# Patient Record
Sex: Male | Born: 1990 | Race: Black or African American | Hispanic: No | Marital: Single | Smoking: Current every day smoker
Health system: Southern US, Community
[De-identification: ages and names within clinical notes are randomized; demographics above are authoritative.]

## PROBLEM LIST (undated history)

## (undated) DIAGNOSIS — K5090 Crohn's disease, unspecified, without complications: Secondary | ICD-10-CM

## (undated) DIAGNOSIS — S060XAA Concussion with loss of consciousness status unknown, initial encounter: Secondary | ICD-10-CM

## (undated) DIAGNOSIS — R634 Abnormal weight loss: Secondary | ICD-10-CM

## (undated) DIAGNOSIS — G2569 Other tics of organic origin: Secondary | ICD-10-CM

## (undated) DIAGNOSIS — S060X9A Concussion with loss of consciousness of unspecified duration, initial encounter: Secondary | ICD-10-CM

## (undated) DIAGNOSIS — F23 Brief psychotic disorder: Secondary | ICD-10-CM

## (undated) DIAGNOSIS — H9190 Unspecified hearing loss, unspecified ear: Secondary | ICD-10-CM

## (undated) HISTORY — DX: Other tics of organic origin: G25.69

## (undated) HISTORY — DX: Concussion with loss of consciousness status unknown, initial encounter: S06.0XAA

## (undated) HISTORY — DX: Unspecified hearing loss, unspecified ear: H91.90

## (undated) HISTORY — DX: Abnormal weight loss: R63.4

## (undated) HISTORY — DX: Crohn's disease, unspecified, without complications: K50.90

## (undated) HISTORY — PX: KNEE SURGERY: SHX244

## (undated) HISTORY — DX: Brief psychotic disorder: F23

## (undated) HISTORY — DX: Concussion with loss of consciousness of unspecified duration, initial encounter: S06.0X9A

---
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
## 2010-01-24 ENCOUNTER — Ambulatory Visit: Payer: Self-pay | Admitting: Pediatrics

## 2010-12-24 ENCOUNTER — Encounter (INDEPENDENT_AMBULATORY_CARE_PROVIDER_SITE_OTHER): Payer: Self-pay | Admitting: *Deleted

## 2011-01-03 NOTE — Letter (Signed)
Summary: New Patient letter  [facility]  [address] [place], [place] [address]   Phone: [phone]  Fax: [phone]       12/24/2010 MRN: [id number]  [facility] [address] [place], [place]  [address]  Dear Mr. Steuber,  Welcome to the Gastroenterology Division at [facility].    You are scheduled to see Dr.  Rob Bunting on February 04, 2011 at 8:30am on the 3rd floor at [facility], [address]  We ask that you try to arrive at our office 15 minutes prior to your appointment time to allow for check-in We would like you to complete the enclosed self-administered evaluation form prior to your visit and bring it with you on the day of your appointment.  We will review it with you.  Also, please bring a complete list of all your medications or, if you prefer, bring the medication bottles and we will list them.  Please bring your insurance card so that we may make a copy of it.  If your insurance requires a referral to see a specialist, please bring your referral form from your primary care physician.  Co-payments are due at the time of your visit and may be paid by cash, check or credit card.     Your office visit will consist of a consult with your physician (includes a physical exam), any laboratory testing he/she may order, scheduling of any necessary diagnostic testing (e.g. x-ray, ultrasound, CT-scan), and scheduling of a procedure (e.g. Endoscopy, Colonoscopy) if required.  Please allow enough time on your schedule to allow for any/all of these possibilities.    If you cannot keep your appointment, please call [phone] to cancel or reschedule prior to your appointment date.  This allows [place] the opportunity to schedule an appointment for another patient in need of care.  If you do not cancel or reschedule by 5 p.m. the business day prior to your appointment date, you will be charged a $50.00 late cancellation/no-show fee.    Thank you for  choosing [facility] for your medical needs.  We appreciate the opportunity to care for you.  Please visit [place] at our website  to learn more about our practice.                     Sincerely,                                                             The Gastroenterology Division

## 2011-01-14 LAB — URINALYSIS, MICROSCOPIC ONLY
Bilirubin Urine: NEGATIVE
Leukocytes, UA: NEGATIVE
Nitrite: NEGATIVE
Specific Gravity, Urine: 1.031 — ABNORMAL HIGH (ref 1.005–1.030)
Urobilinogen, UA: 1 mg/dL (ref 0.0–1.0)
pH: 6.5 (ref 5.0–8.0)

## 2011-01-14 LAB — DIFFERENTIAL
Basophils Relative: 1 % (ref 0–1)
Eosinophils Relative: 2 % (ref 0–5)
Lymphocytes Relative: 10 % — ABNORMAL LOW (ref 24–48)
Monocytes Absolute: 0.3 10*3/uL (ref 0.2–1.2)
Monocytes Relative: 6 % (ref 3–11)
Neutro Abs: 4.3 10*3/uL (ref 1.7–8.0)

## 2011-01-14 LAB — BASIC METABOLIC PANEL
CO2: 28 mEq/L (ref 19–32)
Calcium: 8.8 mg/dL (ref 8.4–10.5)
Glucose, Bld: 75 mg/dL (ref 70–99)
Potassium: 3.7 mEq/L (ref 3.5–5.1)
Sodium: 136 mEq/L (ref 135–145)

## 2011-01-14 LAB — DRUGS OF ABUSE SCREEN W/O ALC, ROUTINE URINE
Amphetamine Screen, Ur: NEGATIVE
Benzodiazepines.: NEGATIVE
Creatinine,U: 285.1 mg/dL
Marijuana Metabolite: POSITIVE — AB
Opiate Screen, Urine: NEGATIVE
Propoxyphene: NEGATIVE

## 2011-01-14 LAB — HEMOGLOBIN A1C
Hgb A1c MFr Bld: 5.8 % (ref 4.6–6.1)
Mean Plasma Glucose: 120 mg/dL

## 2011-01-14 LAB — HEPATIC FUNCTION PANEL
AST: 17 U/L (ref 0–37)
Albumin: 3.1 g/dL — ABNORMAL LOW (ref 3.5–5.2)
Alkaline Phosphatase: 71 U/L (ref 52–171)
Bilirubin, Direct: 0.1 mg/dL (ref 0.0–0.3)
Total Bilirubin: 0.5 mg/dL (ref 0.3–1.2)

## 2011-01-14 LAB — LIPID PANEL
Cholesterol: 125 mg/dL (ref 0–169)
LDL Cholesterol: 70 mg/dL (ref 0–109)
Triglycerides: 61 mg/dL (ref <150)

## 2011-01-14 LAB — CBC
HCT: 41.5 % (ref 36.0–49.0)
Hemoglobin: 13.6 g/dL (ref 12.0–16.0)
MCHC: 32.9 g/dL (ref 31.0–37.0)
RBC: 5.18 MIL/uL (ref 3.80–5.70)
RDW: 14 % (ref 11.4–15.5)

## 2011-01-14 LAB — GC/CHLAMYDIA PROBE AMP, URINE: GC Probe Amp, Urine: NEGATIVE

## 2011-02-04 ENCOUNTER — Ambulatory Visit: Payer: Self-pay | Admitting: Gastroenterology

## 2011-02-19 NOTE — Procedures (Signed)
EEG NUMBER:  [id number].   CLINICAL HISTORY:  The patient is a 20 year old admitted because he was  not interacting with his family.  There is a history of possible  psychotic disorder and anger problems.  The patient had Crohn disease.  He was diagnosed with attention deficit disorder in the past; placed on  Ritalin, which was stopped when he developed motor tic disorder.  (780.02)   PROCEDURE:  The tracing is carried out of 32-channel digital Cadwell  recorder reformatted into 16-channel montages with one devoted to EKG.  The patient was awake and drowsy during the recording.  The  International 10/20 system lead placement was used.   MEDICATIONS:  Include prednisone and Abilify.   DESCRIPTION OF FINDINGS:  Dominant frequency is a 10 Hz, well modulated  and regulated 15-75 microvolt activity that attenuates briskly with eye  opening.  Background activity is predominately beta when the patient's  eyes were open and they were very well defined, broadly distributed  alpha range activity with eye closure.  The patient becomes drowsy with  mixed frequency theta range activity of 5-6 Hz and 30-50 microvolts and  drifts into natural sleep with vertex sharp waves and fragmentary sleep  spindles superimposed upon a delta range background.   Hyperventilation causes arousal again.  Photic stimulation was not  carried out.  EKG showed regular sinus rhythm with ventricular response  of 66 beats per minute.   IMPRESSION:  Normal record with the patient awake and asleep.      Deanna Artis. Sharene Skeans, M.D.  Electronically Signed     EAV:WUJW  D:  07/18/2008 20:09:39  T:  07/19/2008 02:44:49  Job #:  [id number]   cc:   Nelly Rout, MD
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
## 2011-02-22 NOTE — Op Note (Signed)
[name], [name]               ACCOUNT NO.:  [id number]   MEDICAL RECORD NO.:  [id number]          PATIENT TYPE:  AMB   LOCATION:  SDS                          FACILITY:  [facility]   PHYSICIAN:  Jon Gills, M.D.  DATE OF BIRTH:  05-06-1991   DATE OF PROCEDURE:  12/20/2005  DATE OF DISCHARGE:  12/20/2005                                 OPERATIVE REPORT   PREOPERATIVE DIAGNOSIS:  Hematochezia, weight loss and abdominal pain.   POSTOPERATIVE DIAGNOSIS:  Hematochezia, weight loss and abdominal pain.   OPERATION PERFORMED:  Colonoscopy with biopsy.   SURGEON:  Jon Gills, M.D.   ASSISTANT:  None.   DESCRIPTION OF PROCEDURE:  Following informed written consent, the patient  was taken to the operating room and placed under general anesthesia with  continuous cardiopulmonary monitoring.  He remained in supine position.  Examination of the perineum revealed possible hemorrhoids but no tags or  fissures.  Rectal examination revealed an empty rectal vault.  The Olympus  colonoscope was passed per rectum and advanced 160 cm to the cecum.  Multiple biopsies were obtained throughout the colon although there was no  visual evidence of edema, erythema, friability or granularity.  The lips of  the ileocecal valve appeared normal.  All of these biopsies subsequently  revealed focal active colitis with granulomas present establishing the  diagnosis of Crohn's disease.  The endoscope was gradually withdrawn and the  patient was awakened and taken to recovery room in satisfactory condition.  He will be released later today to the care of his family.  Now that his  specific diagnosis has been established, [name] will be placed on medical  management for Crohn's disease.   DESCRIPTION OF TECHNICAL PROCEDURES USED:  Olympus PCF-100 colonoscope with  cold biopsy forceps.   DESCRIPTION OF SPECIMENS REMOVED:  Colon x3 from four separate locations for  a total of 12 biopsies.     ______________________________  Jon Gills, M.D.     JHC/MEDQ  D:  01/24/2006  T:  01/27/2006  Job:  [id number]   cc:   Rosalyn Gess, M.D.  Fax: [phone]

## 2011-02-22 NOTE — Op Note (Signed)
NAMECHEY, [name]               ACCOUNT NO.:  [id number]   MEDICAL RECORD NO.:  [id number]          PATIENT TYPE:  AMB   LOCATION:  SDS                          FACILITY:  [facility]   PHYSICIAN:  Jon Gills, M.D.  DATE OF BIRTH:  [birth year]   DATE OF PROCEDURE:  09/06/2005  DATE OF DISCHARGE:  09/06/2005                                 OPERATIVE REPORT   PREOPERATIVE DIAGNOSIS:  Hematemesis.   POSTOPERATIVE DIAGNOSIS:  Hematemesis with multiple gastric erosions.   OPERATIONS:  Upper gastrointestinal endoscopy with biopsy.   SURGEON:  Jon Gills, M.D.   ASSISTANTS:  None.   DESCRIPTION OF FINDINGS:  Following informed written consent, the patient  was taken to the operating room and placed under general anesthesia with  continuous cardiopulmonary monitoring.  He remained in the supine position  and the Olympus endoscope was passed by mouth without difficulty.  There was  no visual evidence for esophagitis, gastritis, duodenitis, but several  prepyloric erosions were present.  There was minimal nodularity noted in the  stomach.  A solitary gastric biopsy was negative for Helicobacter.  Multiple  esophageal, gastric and duodenal biopsies were normal, except for chronic  gastritis.  [name] tolerated the procedure well and the endoscope was  gradually withdrawn and he was awakened and taken to the recovery room in  satisfactory condition.  He will be released to the care of his parents  later today.  In view of [name]'s multiple gastric erosions, despite  Prevacid therapy, I elected to treat with Prevpac for 14 days, although no  definite Helicobacter organisms were identified.  He will return the office  in approximately 1 month for further evaluation.   DESCRIPTION OF TECHNICAL PROCEDURE USED:  Olympus GIF-140 endoscope with  cold biopsy forceps.   DESCRIPTION OF SPECIMENS REMOVED:  Esophagus x3, in formalin; gastric x3, in  formalin; gastric x1 for CLOtesting;  duodenum x3, in formalin.           ______________________________  Jon Gills, M.D.     JHC/MEDQ  D:  09/19/2005  T:  09/23/2005  Job:  [id number]   cc:   Rosalyn Gess, M.D.  Fax: [phone]

## 2011-05-30 ENCOUNTER — Telehealth: Payer: Self-pay

## 2011-05-30 NOTE — Telephone Encounter (Signed)
Message copied by Donata Duff on Thu May 30, 2011  8:39 AM ------      Message from: Rob Bunting P      Created: Thu May 30, 2011  7:23 AM             [name],      This pt no showed for appt with me in past, but his mother called wanting to get him back in for [facility] with me.  Dx crohn's.              Can you call him today to set up next available ngi with me?            thanks

## 2011-05-30 NOTE — Telephone Encounter (Signed)
Pt mother returned call and appt was made for 07/01/11

## 2011-05-30 NOTE — Telephone Encounter (Signed)
Left message on machine to call back  

## 2011-07-01 ENCOUNTER — Encounter: Payer: Self-pay | Admitting: Gastroenterology

## 2011-07-01 ENCOUNTER — Ambulatory Visit (INDEPENDENT_AMBULATORY_CARE_PROVIDER_SITE_OTHER): Payer: Self-pay | Admitting: Gastroenterology

## 2011-07-01 ENCOUNTER — Other Ambulatory Visit (INDEPENDENT_AMBULATORY_CARE_PROVIDER_SITE_OTHER): Payer: Self-pay

## 2011-07-01 VITALS — BP 102/68 | HR 69 | Ht 72.0 in | Wt 126.0 lb

## 2011-07-01 DIAGNOSIS — K5090 Crohn's disease, unspecified, without complications: Secondary | ICD-10-CM

## 2011-07-01 LAB — COMPREHENSIVE METABOLIC PANEL WITH GFR
ALT: 12 U/L (ref 0–53)
AST: 23 U/L (ref 0–37)
Albumin: 3.8 g/dL (ref 3.5–5.2)
Alkaline Phosphatase: 75 U/L (ref 39–117)
BUN: 13 mg/dL (ref 6–23)
CO2: 27 meq/L (ref 19–32)
Calcium: 8.8 mg/dL (ref 8.4–10.5)
Chloride: 104 meq/L (ref 96–112)
Creatinine, Ser: 1 mg/dL (ref 0.4–1.5)
GFR: 129.64 mL/min (ref >60.00)
Glucose, Bld: 79 mg/dL (ref 70–99)
Potassium: 4.5 meq/L (ref 3.5–5.1)
Sodium: 138 meq/L (ref 135–145)
Total Bilirubin: 0.6 mg/dL (ref 0.3–1.2)
Total Protein: 7.3 g/dL (ref 6.0–8.3)

## 2011-07-01 LAB — CBC WITH DIFFERENTIAL/PLATELET
Basophils Relative: 0.5 % (ref 0.0–3.0)
Eosinophils Relative: 7.9 % — ABNORMAL HIGH (ref 0.0–5.0)
HCT: 41.6 % (ref 39.0–52.0)
Hemoglobin: 13.6 g/dL (ref 13.0–17.0)
Lymphs Abs: 0.5 10*3/uL — ABNORMAL LOW (ref 0.7–4.0)
MCV: 81.3 fl (ref 78.0–100.0)
Monocytes Absolute: 0.4 10*3/uL (ref 0.1–1.0)
Neutro Abs: 3 10*3/uL (ref 1.4–7.7)
RBC: 5.12 Mil/uL (ref 4.22–5.81)
WBC: 4.2 10*3/uL — ABNORMAL LOW (ref 4.5–10.5)

## 2011-07-01 LAB — SEDIMENTATION RATE: Sed Rate: 19 mm/h (ref 0–22)

## 2011-07-01 MED ORDER — HYOSCYAMINE SULFATE ER 0.375 MG PO TB12: 375.0000 ug | ORAL_TABLET | Freq: Two times a day (BID) | ORAL | Status: DC | PRN

## 2011-07-01 MED ORDER — PREDNISONE 5 MG PO TABS: 10.0000 mg | ORAL_TABLET | Freq: Two times a day (BID) | ORAL | Status: AC

## 2011-07-01 NOTE — Progress Notes (Signed)
HPI: This is a      Review of systems: Pertinent positive and negative review of systems were noted in the above HPI section.  All other review of systems was otherwise negative.   Past Medical History  Diagnosis Date  . Hard of hearing   . Tics of organic origin   . Crohn's   . Brief psychotic disorder   . Weight loss   . Cerebral concussion     Past Surgical History  Procedure Date  . Knee surgery      reports that he has quit smoking. He has never used smokeless tobacco. He reports that he does not drink alcohol or use illicit drugs.  family history includes Alcohol abuse in his father; Bipolar disorder in his father; Hypertension in his mother; and Prostate cancer in his paternal grandfather.    Current Medications, Allergies were all reviewed with the patient via [facility] electronic medical record system.    Physical Exam: BP 102/68  Pulse 69  Ht 6' (1.829 m)  Wt 126 lb (57.153 kg)  BMI 17.09 kg/m2 Constitutional: generally well-appearing Psychiatric: alert and oriented x3 Eyes: extraocular movements intact Mouth: oral pharynx moist, no lesions Neck: supple no lymphadenopathy Cardiovascular: heart regular rate and rhythm Lungs: clear to auscultation bilaterally Abdomen: soft, nontender, nondistended, no obvious ascites, no peritoneal signs, normal bowel sounds Extremities: no lower extremity edema bilaterally Skin: no lesions on visible extremities    Assessment and plan: 20 y.o. male with

## 2011-07-01 NOTE — Patient Instructions (Addendum)
One of your biggest health concerns is your smoking.  SMOKING IS TERRIBLE FOR CROHN'S DISEASE and it increases your risk for most cancers and serious cardiovascular diseases such as strokes, heart attacks.  You should try your best to stop.  If you need assistance, please contact your PCP or Smoking Cessation Class at [facility] [phone]) or [facility] Quit-Line (1-800-QUIT-NOW). Start antispasm med twice daily. Start prednisone 10mg  twice daily. You will have labs checked today in the basement lab.  Please head down after you check out with the front desk  (cbc, cmet, esr, prometheus TPMT genetic profile). You will be set up for a colonoscopy. We will get all your records from Dr. Ophelia Charter office.

## 2011-07-01 NOTE — Progress Notes (Signed)
HPI: This is a   very pleasant 20 year old man who I am meeting for the first time today. He tells me that he was diagnosed with Crohn's disease 5 years ago, when he was 20 years old. He used to have a lot of rectal bleeding. I found a colonoscopy report from 2007 describing a normal colon. Several biopsies were taken. I do not have those biopsy reports. He believes he was on prednisone on and off for quite some time. He also was on azathioprine, the dose is unclear to him and he. He has not been on any medicine for at least 2 years.  He has chronic right-sided abdominal pain, intermittent spasms of pain as well. He has at least 2 semi-formed stools daily sometimes a bit more. He has no nocturnal symptoms. He has never had surgery. His last colonoscopy was 3-5 years ago as far as he knows.   Review of systems: Pertinent positive and negative review of systems were noted in the above HPI section.  All other review of systems was otherwise negative.   Past Medical History  Diagnosis Date  . Hard of hearing   . Tics of organic origin   . Crohn's   . Brief psychotic disorder   . Weight loss   . Cerebral concussion     Past Surgical History  Procedure Date  . Knee surgery      reports that he has quit smoking. He has never used smokeless tobacco. He reports that he does not drink alcohol or use illicit drugs.  family history includes Alcohol abuse in his father; Bipolar disorder in his father; Hypertension in his mother; and Prostate cancer in his paternal grandfather.    Current Medications, Allergies were all reviewed with the patient via [facility] electronic medical record system.    Physical Exam: BP 102/68  Pulse 69  Ht 6' (1.829 m)  Wt 126 lb (57.153 kg)  BMI 17.09 kg/m2 Constitutional: A very thin young man Psychiatric: alert and oriented x3 Eyes: extraocular movements intact Mouth: oral pharynx moist, no lesions Neck: supple no lymphadenopathy Cardiovascular:  heart regular rate and rhythm Lungs: clear to auscultation bilaterally Abdomen: soft, mildly tender right side abdomen, nondistended, no obvious ascites, no peritoneal signs, normal bowel sounds Extremities: no lower extremity edema bilaterally Skin: no lesions on visible extremities    Assessment and plan: 20 y.o. male with history of Crohn's disease   We will need to go her records from Dr. Ophelia Charter office sent here. He needs a reassessment of his colon and terminal ileum with colonoscopy. I think some of his pains sound spasm related, he says if he belches the pains often improve. I will start him on antispasmodics and twice daily. I will also start him on medium dose steroids 10 mg twice daily. You get a basic set of labs today including  tpmt genetic profiling. I think I would like to try him again on azathioprine at higher doses than he may have had in the past.

## 2011-07-05 ENCOUNTER — Ambulatory Visit: Payer: Self-pay | Admitting: Gastroenterology

## 2011-07-05 ENCOUNTER — Encounter: Payer: Self-pay | Admitting: Gastroenterology

## 2011-07-05 ENCOUNTER — Telehealth: Payer: Self-pay | Admitting: *Deleted

## 2011-07-05 ENCOUNTER — Telehealth: Payer: Self-pay

## 2011-07-05 MED ORDER — SODIUM CHLORIDE 0.9 % IV SOLN: 500.0000 mL | INTRAVENOUS | Status: DC

## 2011-07-05 NOTE — Telephone Encounter (Signed)
Message copied by Donata Duff on Fri Jul 05, 2011 11:23 AM ------      Message from: Rachael Fee      Created: Fri Jul 05, 2011 10:53 AM       Lanore Renderos, can you turn this into a phone note.            We need to reschedule using gatorade, miralax prep.  Also prescribe him 4mg  zofran pills, he needs to take one pill 1 hour prior to starting the prep and 1 pill 2 hours later. (2 pills, no refills)            Also prescribe him reglan 10mg  pills, 3 pills, he needs to take one pill 1 hour prior to starting the prep and then use the other 2 pills every 5 hours as needed for more nausea            Thanks                        ----- Message -----         From: Kerman Passey, RN         Sent: 07/05/2011  10:11 AM           To: Rob Bunting, MD            Dr Christella Hartigan,      This patient came in for his procedure 07/05/11 and was unable to drink his prep at all. He stated every time he drank the prep both times, he vomited a large amount. He had a bowel movement on Wednesday that was normal and none since. He did not prep at all due to the intolerance. Please advise. Thanks,                                                   Hilda Lias

## 2011-07-05 NOTE — Telephone Encounter (Signed)
[place] rescheduled pt and previsit Dr Christella Hartigan prep instructions copied into the appt notes

## 2011-07-08 NOTE — Telephone Encounter (Signed)
error 

## 2011-07-09 LAB — BASIC METABOLIC PANEL
CO2: 29
Calcium: 9.3
Creatinine, Ser: 0.78
Glucose, Bld: 83
Sodium: 139

## 2011-07-09 LAB — URINALYSIS, ROUTINE W REFLEX MICROSCOPIC
Hgb urine dipstick: NEGATIVE
Specific Gravity, Urine: 1.027
Urobilinogen, UA: 0.2

## 2011-07-09 LAB — HEPATIC FUNCTION PANEL
AST: 20
Albumin: 3.3 — ABNORMAL LOW
Alkaline Phosphatase: 77
Bilirubin, Direct: 0.1
Total Bilirubin: 0.7

## 2011-07-09 LAB — GC/CHLAMYDIA PROBE AMP, URINE: Chlamydia, Swab/Urine, PCR: POSITIVE — AB

## 2011-07-09 LAB — URINE MICROSCOPIC-ADD ON

## 2011-07-09 LAB — DRUGS OF ABUSE SCREEN W/O ALC, ROUTINE URINE
Barbiturate Quant, Ur: NEGATIVE
Creatinine,U: 200.9
Marijuana Metabolite: NEGATIVE
Methadone: NEGATIVE
Opiate Screen, Urine: NEGATIVE

## 2011-07-09 LAB — RPR: RPR Ser Ql: NONREACTIVE

## 2011-07-09 LAB — GAMMA GT: GGT: 22

## 2011-07-14 ENCOUNTER — Telehealth: Payer: Self-pay | Admitting: Gastroenterology

## 2011-07-14 NOTE — Telephone Encounter (Signed)
Prometheus tests scanned in,  Normal tpmt activity

## 2011-07-15 ENCOUNTER — Telehealth: Payer: Self-pay | Admitting: Gastroenterology

## 2011-07-15 NOTE — Telephone Encounter (Signed)
Reviewed records from Dr. Chestine Spore who saw him for many years. Colonoscopy 12/2005 described normal examination but he wrote that biopsies showed inflammation with granulomas.  I did not see actual path reports.  He was on prednisone for many years at varying doses. He was on ASA products, I cannot tell why those were stopped.  He was put on imuran as well, up to 50mg  a day to try to wean him off prednisone.

## 2011-07-16 ENCOUNTER — Encounter: Payer: Self-pay | Admitting: Gastroenterology

## 2011-08-07 ENCOUNTER — Other Ambulatory Visit: Payer: Self-pay | Admitting: Gastroenterology

## 2011-08-07 ENCOUNTER — Emergency Department (HOSPITAL_COMMUNITY)
Admission: EM | Admit: 2011-08-07 | Discharge: 2011-08-07 | Disposition: A | Discharge status: Home / Self Care | Payer: Self-pay | Attending: Emergency Medicine | Admitting: Emergency Medicine

## 2011-08-07 DIAGNOSIS — R51 Headache: Secondary | ICD-10-CM | POA: Insufficient documentation

## 2011-08-07 DIAGNOSIS — R000 Tachycardia, unspecified: Secondary | ICD-10-CM | POA: Insufficient documentation

## 2011-08-07 DIAGNOSIS — I10 Essential (primary) hypertension: Secondary | ICD-10-CM | POA: Insufficient documentation

## 2011-08-07 DIAGNOSIS — K5090 Crohn's disease, unspecified, without complications: Secondary | ICD-10-CM | POA: Insufficient documentation

## 2011-08-07 DIAGNOSIS — R5381 Other malaise: Secondary | ICD-10-CM | POA: Insufficient documentation

## 2011-08-07 DIAGNOSIS — R5383 Other fatigue: Secondary | ICD-10-CM | POA: Insufficient documentation

## 2011-08-07 LAB — URINALYSIS, ROUTINE W REFLEX MICROSCOPIC
Glucose, UA: NEGATIVE mg/dL
Hgb urine dipstick: NEGATIVE
Ketones, ur: 15 mg/dL — AB
Protein, ur: 100 mg/dL — AB
Urobilinogen, UA: 0.2 mg/dL (ref 0.0–1.0)

## 2011-08-07 LAB — RAPID URINE DRUG SCREEN, HOSP PERFORMED
Amphetamines: NOT DETECTED
Barbiturates: NOT DETECTED
Benzodiazepines: NOT DETECTED
Cocaine: NOT DETECTED
Opiates: NOT DETECTED
Tetrahydrocannabinol: POSITIVE — AB

## 2011-08-14 ENCOUNTER — Ambulatory Visit (AMBULATORY_SURGERY_CENTER): Payer: Self-pay | Admitting: *Deleted

## 2011-08-14 VITALS — Ht 72.0 in | Wt 130.0 lb

## 2011-08-14 DIAGNOSIS — K5090 Crohn's disease, unspecified, without complications: Secondary | ICD-10-CM

## 2011-08-14 MED ORDER — POLYETHYLENE GLYCOL 3350 17 GM/SCOOP PO POWD: ORAL | Status: DC

## 2011-08-14 MED ORDER — METOCLOPRAMIDE HCL 10 MG PO TABS: 10.0000 mg | ORAL_TABLET | ORAL | Status: DC

## 2011-08-14 MED ORDER — ONDANSETRON HCL 4 MG PO TABS: 4.0000 mg | ORAL_TABLET | Freq: Every day | ORAL | Status: DC | PRN

## 2011-08-28 ENCOUNTER — Encounter: Payer: Self-pay | Admitting: Gastroenterology

## 2011-08-28 ENCOUNTER — Other Ambulatory Visit: Payer: Self-pay | Admitting: Gastroenterology

## 2011-08-28 ENCOUNTER — Ambulatory Visit (AMBULATORY_SURGERY_CENTER): Payer: Self-pay | Admitting: Gastroenterology

## 2011-08-28 VITALS — BP 106/63 | HR 60 | Temp 96.5°F | Resp 18 | Ht 72.0 in | Wt 130.0 lb

## 2011-08-28 DIAGNOSIS — K5090 Crohn's disease, unspecified, without complications: Secondary | ICD-10-CM

## 2011-08-28 MED ORDER — SODIUM CHLORIDE 0.9 % IV SOLN: 500.0000 mL | INTRAVENOUS | Status: DC

## 2011-08-28 MED ORDER — DISPOSABLE ENEMA 19-7 GM/118ML RE ENEM: 1.0000 | ENEMA | Freq: Once | RECTAL | Status: DC

## 2011-08-28 NOTE — Progress Notes (Signed)
Patient did not experience any of the following events: a burn prior to discharge; a fall within the facility; wrong site/side/patient/procedure/implant event; or a hospital transfer or hospital admission upon discharge from the facility. (G8907) Patient did not have preoperative order for IV antibiotic SSI prophylaxis. (G8918)  

## 2011-08-28 NOTE — Progress Notes (Signed)
Pt still in restroom and Dr Christella Hartigan in to admitting and gave instructions to get him out of bathroom and put his IV in so we can get his procedure started.  Pt stated when doing the enema as soon as he completed the enema it came right back out as clear water.

## 2011-08-28 NOTE — Progress Notes (Signed)
Pt came in sipping prep mixed with gatorade.  States he took first dose of prep yesterday evening and only had one solid bowel movement and is having a really hard time getting second dose of prep down this am.  Dr Gerilyn Pilgrim notified and he came in to admitting and spoke with patient. Dr Christella Hartigan gave instructions to give one fleets enema and check stool again. Pt now in bathroom completing fleets enema.

## 2011-08-28 NOTE — Patient Instructions (Signed)
See paper with specific instructions by Dr. Christella Hartigan.   Read discharge instructions given to you by your recovery room nurse.

## 2011-09-02 ENCOUNTER — Telehealth: Payer: Self-pay | Admitting: *Deleted

## 2011-09-02 NOTE — Telephone Encounter (Signed)
No answer left message.

## 2011-09-03 NOTE — Telephone Encounter (Signed)
Both of these medicines were already called into his pharmacy at the time of the last office visit.  He cannot have any refills until he sees me for rov

## 2011-09-03 NOTE — Telephone Encounter (Signed)
Pt aware ROV scheduled 

## 2011-09-17 ENCOUNTER — Ambulatory Visit: Payer: Self-pay | Admitting: Gastroenterology

## 2011-10-11 ENCOUNTER — Ambulatory Visit (INDEPENDENT_AMBULATORY_CARE_PROVIDER_SITE_OTHER): Payer: Self-pay | Admitting: Gastroenterology

## 2011-10-11 ENCOUNTER — Encounter: Payer: Self-pay | Admitting: Gastroenterology

## 2011-10-11 VITALS — BP 108/60 | HR 88 | Ht 72.0 in | Wt 127.0 lb

## 2011-10-11 DIAGNOSIS — K5090 Crohn's disease, unspecified, without complications: Secondary | ICD-10-CM

## 2011-10-11 MED ORDER — AZATHIOPRINE 50 MG PO TABS: 100.0000 mg | ORAL_TABLET | Freq: Every day | ORAL | Status: DC

## 2011-10-11 NOTE — Patient Instructions (Addendum)
New prescription called in for azathiaprine 100mg  once daily.  You will be set up for a CTenterography of abdomen and pelvis with IV and oral contrast for staging of Crohn's.    You have been scheduled for a CT scan of the abdomen and pelvis at [facility] ([address]---this is in the same building as Architectural technologist).   You are scheduled on 10/15/11  at 11 am . You should arrive 1 hour  prior to your appointment time for registration. Please follow the written instructions below on the day of your exam:  WARNING: IF YOU ARE ALLERGIC TO IODINE/X-RAY DYE, PLEASE NOTIFY RADIOLOGY IMMEDIATELY AT [phone]! YOU WILL BE GIVEN A 13 HOUR PREMEDICATION PREP.  1) Do not eat or drink anything after 7 am  (4 hours prior to your test)   If you have any questions regarding your exam or if you need to reschedule, you may call the CT department at [phone] between the hours of 8:00 am and 5:00 pm, Monday-Friday.  ________________________________________________________________________  Stay on 5mg  twice daily predisone. Return to see Dr. Christella Hartigan in 4 weeks. You will have labs checked in 2 weeks (cmet, cbc).   10/25/11 [facility] Any time from 730 am- 5 pm.

## 2011-10-11 NOTE — Progress Notes (Signed)
Review of pertinent gastrointestinal problems: 1. Crohn's disease: Diagnosed 2007 by Dr. Chestine Spore. Colonoscopy March 2007 by Dr. Chestine Spore appeared normal but biopsies showed inflammation with granulomas according to Dr. Chestine Spore office note. Colonoscopy November 2012 by Dr. Christella Hartigan was very poorly prepped, and this "greatly limited the examination", terminal ileum was not able to be intubated.  tpmt enzyme activity was normal October 2012. 2. well-documented medical noncompliance    HPI: This is a  pleasant 21 year old man whom I last saw about 2 or 3 months ago at the time of a colonoscopy.   abd feels good.  pred 5mg  twice daily.  antispasm twice a day.  He got a refill of both of them yesterday, was vomiting yesterday.      Past Medical History  Diagnosis Date  . Hard of hearing   . Tics of organic origin   . Crohn's   . Brief psychotic disorder   . Weight loss   . Cerebral concussion     Past Surgical History  Procedure Date  . Knee surgery     Current Outpatient Prescriptions  Medication Sig Dispense Refill  . ARIPiprazole (ABILIFY) 5 MG tablet Take 5 mg by mouth daily.        . Dicyclomine HCl (BENTYL PO) Take by mouth as directed.        . predniSONE (DELTASONE) 5 MG tablet Take 5 mg by mouth 2 (two) times daily.          Allergies as of 10/11/2011 - Review Complete 10/11/2011  Allergen Reaction Noted  . Ibuprofen Nausea And Vomiting 07/01/2011    Family History  Problem Relation Age of Onset  . Bipolar disorder Father   . Hypertension Mother   . Prostate cancer Paternal Grandfather   . Alcohol abuse Father     History   Social History  . Marital Status: Single    Spouse Name: N/A    Number of Children: 1  . Years of Education: N/A   Occupational History  . temp work    Social History Main Topics  . Smoking status: Former Smoker    Quit date: 05/14/2011  . Smokeless tobacco: Never Used  . Alcohol Use: No  . Drug Use: 5 per week    Special: Marijuana   MARIJUANNA  . Sexually Active: Not on file   Other Topics Concern  . Not on file   Social History Narrative  . No narrative on file      Physical Exam: BP 108/60  Pulse 88  Ht 6' (1.829 m)  Wt 127 lb (57.607 kg)  BMI 17.22 kg/m2 Constitutional: generally well-appearing Psychiatric: alert and oriented x3 Abdomen: soft, nontender, nondistended, no obvious ascites, no peritoneal signs, normal bowel sounds     Assessment and plan: 21 y.o. male with history of Crohn's disease  First, his colonoscopy 2 months ago was not helpful, he was very poorly prepped and I did not get an idea of the extent of his disease. I am going to set up CT enterography to try to define this better. I do believe he has Crohn's disease based on previous workup and his clinical history.  I am going to start him on azathioprine 100 mg a day which is almost 2 mg per kilogram. He will not regain from his prednisone which at 10 mg a day is very helpful. He will have labs checked in 2 weeks and he will return to see me in 4 weeks. I made it very clear  to him that this new medicine, azathioprine, is potentially very dangerous and requires close followup.

## 2011-10-15 ENCOUNTER — Other Ambulatory Visit: Payer: Self-pay

## 2011-10-15 ENCOUNTER — Telehealth: Payer: Self-pay

## 2011-10-15 NOTE — Telephone Encounter (Signed)
Message copied by Donata Duff on Tue Oct 15, 2011  8:12 AM ------      Message from: Donata Duff      Created: Fri Oct 11, 2011 11:18 AM       Pt to get labs

## 2011-10-15 NOTE — Telephone Encounter (Signed)
Pt aware to have labs  

## 2011-10-17 ENCOUNTER — Other Ambulatory Visit: Payer: Self-pay

## 2011-11-12 ENCOUNTER — Ambulatory Visit: Payer: Self-pay | Admitting: Gastroenterology

## 2011-12-02 ENCOUNTER — Ambulatory Visit (INDEPENDENT_AMBULATORY_CARE_PROVIDER_SITE_OTHER): Payer: Self-pay | Admitting: Gastroenterology

## 2011-12-02 ENCOUNTER — Encounter: Payer: Self-pay | Admitting: Gastroenterology

## 2011-12-02 VITALS — BP 110/68 | HR 74 | Ht 72.0 in | Wt 141.0 lb

## 2011-12-02 DIAGNOSIS — K5090 Crohn's disease, unspecified, without complications: Secondary | ICD-10-CM

## 2011-12-02 DIAGNOSIS — Z91199 Patient's noncompliance with other medical treatment and regimen due to unspecified reason: Secondary | ICD-10-CM

## 2011-12-02 DIAGNOSIS — Z9119 Patient's noncompliance with other medical treatment and regimen: Secondary | ICD-10-CM

## 2011-12-02 MED ORDER — AZATHIOPRINE 50 MG PO TABS: 100.0000 mg | ORAL_TABLET | Freq: Every day | ORAL | Status: DC

## 2011-12-02 MED ORDER — PREDNISONE 5 MG PO TABS: 10.0000 mg | ORAL_TABLET | Freq: Every day | ORAL | Status: AC

## 2011-12-02 NOTE — Patient Instructions (Addendum)
You will be set up for a CT scan of abdomen and pelvis with IV and oral contrast (CT enterography)  See instructions below New prescription for prednsione 5mg  (one pill twice a day) called in with one refill. Azathiaprine 50mg  pills (take 2 pills once daily) one refill called in. You need cbc, cmet in 2 weeks (to monitor the potentially dangerous side effects of the azathiaprine)  12/16/11 [facility] no appointment needed.  730 am- 5 pm. Return to see Dr. Christella Hartigan in 5-6 weeks.   You have been scheduled for a CT scan of the abdomen and pelvis at [facility] ([address]---this is in the same building as Architectural technologist).   You are scheduled on 12/05/11 at 2 pm . You should arrive 1 hour prior to your appointment time for registration. Please follow the written instructions below on the day of your exam:  WARNING: IF YOU ARE ALLERGIC TO IODINE/X-RAY DYE, PLEASE NOTIFY RADIOLOGY IMMEDIATELY AT [phone]! YOU WILL BE GIVEN A 13 HOUR PREMEDICATION PREP.  1) Do not eat or drink anything after 10 am  (4 hours prior to your test)   If you have any questions regarding your exam or if you need to reschedule, you may call the CT department at [phone] between the hours of 8:00 am and 5:00 pm, Monday-Friday.  ________________________________________________________________________

## 2011-12-02 NOTE — Progress Notes (Signed)
Review of pertinent gastrointestinal problems:  1. Crohn's disease: Diagnosed 2007 by Dr. Chestine Spore. Colonoscopy March 2007 by Dr. Chestine Spore appeared normal but biopsies showed inflammation with granulomas according to Dr. Chestine Spore office note. Colonoscopy November 2012 by Dr. Christella Hartigan was very poorly prepped, and this "greatly limited the examination", terminal ileum was not able to be intubated. tpmt enzyme activity was normal October 2012. December 2012 recommended CT enterography but he canceled the appointment and then no showed for the next scheduled radiology appointment  2. well-documented medical noncompliance   HPI: This is a  pleasant 21 year old man whom I last saw about 7-8 weeks ago. Since then he did not go to the CT scan which I recommended, he canceled that appointment twice. He did not start the immunomodulating her I recommended. He has simply been taking antispasmodics and prednisone daily.  He is feeling better.    He was taking prednisone 5 mg bid.  And antispasm 1 pill twice a day.  Never started the recommended and prescribed azathiaprine.  Never had the   Past Medical History  Diagnosis Date  . Hard of hearing   . Tics of organic origin   . Crohn's   . Brief psychotic disorder   . Weight loss   . Cerebral concussion     Past Surgical History  Procedure Date  . Knee surgery     Current Outpatient Prescriptions  Medication Sig Dispense Refill  . ARIPiprazole (ABILIFY) 5 MG tablet Take 5 mg by mouth daily.        Marland Kitchen azaTHIOprine (IMURAN) 50 MG tablet Take 2 tablets (100 mg total) by mouth daily.  60 tablet  1  . Dicyclomine HCl (BENTYL PO) Take by mouth as directed.        . predniSONE (DELTASONE) 5 MG tablet Take 5 mg by mouth 2 (two) times daily.          Allergies as of 12/02/2011 - Review Complete 12/02/2011  Allergen Reaction Noted  . Ibuprofen Nausea And Vomiting 07/01/2011    Family History  Problem Relation Age of Onset  . Bipolar disorder Father   .  Hypertension Mother   . Prostate cancer Paternal Grandfather   . Alcohol abuse Father   . Colon cancer Neg Hx     History   Social History  . Marital Status: Single    Spouse Name: N/A    Number of Children: 1  . Years of Education: N/A   Occupational History  . Temp Work     Social History Main Topics  . Smoking status: Former Smoker    Quit date: 05/14/2011  . Smokeless tobacco: Never Used  . Alcohol Use: No  . Drug Use: 5 per week    Special: Marijuana     MARIJUANNA  . Sexually Active: Not on file   Other Topics Concern  . Not on file   Social History Narrative  . No narrative on file      Physical Exam: BP 110/68  Pulse 74  Ht 6' (1.829 m)  Wt 141 lb (63.957 kg)  BMI 19.12 kg/m2 Constitutional: In the otherwise well-appearing Psychiatric: alert and oriented x3 Abdomen: soft, nontender, nondistended, no obvious ascites, no peritoneal signs, normal bowel sounds     Assessment and plan: 21 y.o. male with Crohn's disease, medical noncompliance   He is a pleasant young man but he is really not taking care of his chronic condition. He has not shown for radiology tests I recommend, he did  not start immunomodulators like her recommended. Spoken very honestly about the potential for dangerous situations with his disease and treatment recommended. I am going to try him again on immunomodulators, prednisone at current dose 5 mg twice daily until he returns to see me in 4-6 weeks in followup. We'll also try to have him get the CT scan I recommended previously. He'll get a basic set of labs, complete metabolic profile, CBC 2 weeks from now after starting his azathioprine.

## 2011-12-05 ENCOUNTER — Ambulatory Visit (INDEPENDENT_AMBULATORY_CARE_PROVIDER_SITE_OTHER)
Admission: RE | Admit: 2011-12-05 | Discharge: 2011-12-05 | Disposition: A | Discharge status: Home / Self Care | Payer: Self-pay | Source: Ambulatory Visit | Attending: Gastroenterology | Admitting: Gastroenterology

## 2011-12-05 DIAGNOSIS — K5090 Crohn's disease, unspecified, without complications: Secondary | ICD-10-CM

## 2011-12-05 MED ORDER — IOHEXOL 300 MG/ML  SOLN
100.0000 mL | Freq: Once | INTRAMUSCULAR | Status: AC | PRN
  Administered 2011-12-05: 100 mL via INTRAVENOUS

## 2011-12-16 ENCOUNTER — Telehealth: Payer: Self-pay

## 2011-12-16 NOTE — Telephone Encounter (Signed)
Pt has been reminded to have labs and will come in this week.

## 2011-12-16 NOTE — Telephone Encounter (Signed)
Message copied by Donata Duff on Mon Dec 16, 2011  8:11 AM ------      Message from: Donata Duff      Created: Mon Dec 02, 2011  3:29 PM       Pt to get labs

## 2012-01-13 ENCOUNTER — Encounter: Payer: Self-pay | Admitting: Gastroenterology

## 2012-01-13 ENCOUNTER — Ambulatory Visit: Payer: Self-pay | Admitting: Gastroenterology

## 2012-01-15 ENCOUNTER — Telehealth: Payer: Self-pay | Admitting: Gastroenterology

## 2012-01-15 NOTE — Telephone Encounter (Signed)
Dismissal Letter sent by Certified Mail 01/15/2012  Dismissal Letter returned Unclaimed 02/05/2012  Dismissal Letter mailed by 1st Class Mail to [address] [place], [place] 02/05/2012

## 2012-05-16 ENCOUNTER — Emergency Department (HOSPITAL_COMMUNITY)
Admission: EM | Admit: 2012-05-16 | Discharge: 2012-05-16 | Disposition: A | Discharge status: Home / Self Care | Payer: Self-pay | Attending: Emergency Medicine | Admitting: Emergency Medicine

## 2012-05-16 ENCOUNTER — Encounter (HOSPITAL_COMMUNITY): Payer: Self-pay | Admitting: *Deleted

## 2012-05-16 ENCOUNTER — Emergency Department (HOSPITAL_COMMUNITY): Payer: Self-pay

## 2012-05-16 DIAGNOSIS — S01501A Unspecified open wound of lip, initial encounter: Secondary | ICD-10-CM | POA: Insufficient documentation

## 2012-05-16 DIAGNOSIS — Y92009 Unspecified place in unspecified non-institutional (private) residence as the place of occurrence of the external cause: Secondary | ICD-10-CM | POA: Insufficient documentation

## 2012-05-16 DIAGNOSIS — S01511A Laceration without foreign body of lip, initial encounter: Secondary | ICD-10-CM

## 2012-05-16 DIAGNOSIS — Z23 Encounter for immunization: Secondary | ICD-10-CM | POA: Insufficient documentation

## 2012-05-16 DIAGNOSIS — K5090 Crohn's disease, unspecified, without complications: Secondary | ICD-10-CM | POA: Insufficient documentation

## 2012-05-16 MED ORDER — HYDROCODONE-ACETAMINOPHEN 5-325 MG PO TABS
1.0000 | ORAL_TABLET | Freq: Once | ORAL | Status: AC
  Administered 2012-05-16: 1 via ORAL
  Filled 2012-05-16: qty 1

## 2012-05-16 MED ORDER — TETANUS-DIPHTH-ACELL PERTUSSIS 5-2.5-18.5 LF-MCG/0.5 IM SUSP
0.5000 mL | Freq: Once | INTRAMUSCULAR | Status: AC
  Administered 2012-05-16: 0.5 mL via INTRAMUSCULAR
  Filled 2012-05-16: qty 0.5

## 2012-05-16 MED ORDER — HYDROCODONE-ACETAMINOPHEN 5-500 MG PO TABS: 2.0000 | ORAL_TABLET | Freq: Four times a day (QID) | ORAL | Status: AC | PRN

## 2012-05-16 MED ORDER — CEPHALEXIN 500 MG PO CAPS: 500.0000 mg | ORAL_CAPSULE | Freq: Four times a day (QID) | ORAL | Status: AC

## 2012-05-16 NOTE — ED Notes (Signed)
Pt also has a contusion to his lip and nose. Bleeding controlled, denies loc.
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
## 2012-09-09 ENCOUNTER — Encounter (HOSPITAL_COMMUNITY): Payer: Self-pay

## 2012-09-09 ENCOUNTER — Emergency Department (HOSPITAL_COMMUNITY)
Admission: EM | Admit: 2012-09-09 | Discharge: 2012-09-09 | Disposition: A | Discharge status: Home / Self Care | Payer: Self-pay | Attending: Emergency Medicine | Admitting: Emergency Medicine

## 2012-09-09 DIAGNOSIS — Z87828 Personal history of other (healed) physical injury and trauma: Secondary | ICD-10-CM | POA: Insufficient documentation

## 2012-09-09 DIAGNOSIS — Z8659 Personal history of other mental and behavioral disorders: Secondary | ICD-10-CM | POA: Insufficient documentation

## 2012-09-09 DIAGNOSIS — G2569 Other tics of organic origin: Secondary | ICD-10-CM | POA: Insufficient documentation

## 2012-09-09 DIAGNOSIS — L0231 Cutaneous abscess of buttock: Secondary | ICD-10-CM | POA: Insufficient documentation

## 2012-09-09 DIAGNOSIS — Z8719 Personal history of other diseases of the digestive system: Secondary | ICD-10-CM | POA: Insufficient documentation

## 2012-09-09 DIAGNOSIS — Z87891 Personal history of nicotine dependence: Secondary | ICD-10-CM | POA: Insufficient documentation

## 2012-09-09 MED ORDER — OXYCODONE-ACETAMINOPHEN 5-325 MG PO TABS: 2.0000 | ORAL_TABLET | ORAL | Status: DC | PRN

## 2012-09-09 NOTE — ED Provider Notes (Signed)
History   This chart was scribed for Cole Horn, MD by Gerlean Ren, ED Scribe. This patient was seen in room TR07C/TR07C and the patient's care was started at 12:10 PM    CSN: [id number]  Arrival date & time 09/09/12  1039   First MD Initiated Contact with Patient 09/09/12 1210      Chief Complaint  Patient presents with  . Abscess     The history is provided by the patient. No language interpreter was used.   [name] is a 21 y.o. male with h/o Crohn's disease who presents to the Emergency Department complaining of a non-draining abscess on left buttocks near the anus.  Pt denies fever, cough, nausea, emesis, diarrhea, rectal bleeding, abdominal pain, chest pain, dyspnea, urinary symptoms, rash, and confusion as associated symptoms.  Pt is a former smoker and reports alcohol use.   Past Medical History  Diagnosis Date  . Hard of hearing   . Tics of organic origin   . Crohn's   . Brief psychotic disorder   . Weight loss   . Cerebral concussion     Past Surgical History  Procedure Date  . Knee surgery     Family History  Problem Relation Age of Onset  . Bipolar disorder Father   . Hypertension Mother   . Prostate cancer Paternal Grandfather   . Alcohol abuse Father   . Colon cancer Neg Hx     History  Substance Use Topics  . Smoking status: Former Smoker    Quit date: 05/14/2011  . Smokeless tobacco: Never Used  . Alcohol Use: Yes      Review of Systems 10 Systems reviewed and are negative for acute change except as noted in the HPI.   Allergies  Ibuprofen  Home Medications   Current Outpatient Rx  Name  Route  Sig  Dispense  Refill  . PREDNISONE 5 MG PO TABS   Oral   Take 5 mg by mouth 2 (two) times daily.           . OXYCODONE-ACETAMINOPHEN 5-325 MG PO TABS   Oral   Take 2 tablets by mouth every 4 (four) hours as needed for pain.   20 tablet   0     BP 112/76  Pulse 118  Temp 97.9 F (36.6 C)  Resp 16  SpO2 93%  Physical  Exam  Nursing note and vitals reviewed. Constitutional:       Awake, alert, nontoxic appearance.  HENT:  Head: Atraumatic.  Eyes: Right eye exhibits no discharge. Left eye exhibits no discharge.  Neck: Neck supple.  Cardiovascular: Normal rate, regular rhythm and normal heart sounds.   Pulmonary/Chest: Effort normal and breath sounds normal. He exhibits no tenderness.  Abdominal: Soft. There is no tenderness. There is no rebound.  Musculoskeletal: He exhibits no tenderness.       Baseline ROM, no obvious new focal weakness.  Neurological:       Mental status and motor strength appears baseline for patient and situation.  Skin: No rash noted.       Left buttocks 4cm abscess near the base of the sacrum at the gluteal fold.  Non-draining.  Positive subcutaneous fluid collection in the area seen on bedside ultrasound.    Psychiatric: He has a normal mood and affect.    ED Course  Procedures (including critical care time) DIAGNOSTIC STUDIES: Oxygen Saturation is 93% on room air, adequate by my interpretation.    COORDINATION OF  CARE: 12:20 PM- Patient informed of clinical course, understands medical decision-making process, and agrees with plan.  Discussed I&D.    INCISION AND DRAINAGE Performed by: [name] Consent: Verbal consent obtained. Risks and benefits: risks, benefits and alternatives were discussed Time out performed prior to procedure Type: abscess Body area: left buttocks Anesthesia: local infiltration 10ml lido 1% with epi Incision was made with a scalpel. Local anesthetic: lidocaine 1% with epinephrine Anesthetic total: 10 ml Complexity: complex Blunt dissection to break up loculations Drainage: purulent Drainage amount: copious Packing material: none Patient tolerance: Patient tolerated the procedure well with no immediate complications.    Labs Reviewed - No data to display No results found.   1. Abscess of buttock, left       MDM  Pt stable  in ED with no significant deterioration in condition.  Patient / Family / Caregiver informed of clinical course, understand medical decision-making process, and agree with plan.  I doubt any other EMC precluding discharge at this time including, but not necessarily limited to the following:sepsis.  I personally performed the services described in this documentation, which was scribed in my presence. The recorded information has been reviewed and is accurate.       Cole Horn, MD 09/14/12 [phone]

## 2012-09-09 NOTE — ED Notes (Signed)
Pt sts he has a abcess on his buttocks.  Pt sts it appeared 3 days ago.

## 2012-09-09 NOTE — ED Notes (Signed)
MD at bedside. 

## 2012-09-09 NOTE — ED Notes (Signed)
Pt reports boil/abscess to left buttocks x 3 days. No drainage. Pain with palpation.

## 2012-12-15 ENCOUNTER — Emergency Department (HOSPITAL_COMMUNITY)
Admission: EM | Admit: 2012-12-15 | Discharge: 2012-12-15 | Disposition: A | Discharge status: Home / Self Care | Payer: Self-pay | Attending: Emergency Medicine | Admitting: Emergency Medicine

## 2012-12-15 ENCOUNTER — Encounter (HOSPITAL_COMMUNITY): Payer: Self-pay | Admitting: Emergency Medicine

## 2012-12-15 DIAGNOSIS — S0993XA Unspecified injury of face, initial encounter: Secondary | ICD-10-CM | POA: Insufficient documentation

## 2012-12-15 DIAGNOSIS — Z8669 Personal history of other diseases of the nervous system and sense organs: Secondary | ICD-10-CM | POA: Insufficient documentation

## 2012-12-15 DIAGNOSIS — K5090 Crohn's disease, unspecified, without complications: Secondary | ICD-10-CM | POA: Insufficient documentation

## 2012-12-15 DIAGNOSIS — M542 Cervicalgia: Secondary | ICD-10-CM

## 2012-12-15 DIAGNOSIS — Z79899 Other long term (current) drug therapy: Secondary | ICD-10-CM | POA: Insufficient documentation

## 2012-12-15 DIAGNOSIS — Y939 Activity, unspecified: Secondary | ICD-10-CM | POA: Insufficient documentation

## 2012-12-15 DIAGNOSIS — Z87891 Personal history of nicotine dependence: Secondary | ICD-10-CM | POA: Insufficient documentation

## 2012-12-15 DIAGNOSIS — Y92410 Unspecified street and highway as the place of occurrence of the external cause: Secondary | ICD-10-CM | POA: Insufficient documentation

## 2012-12-15 DIAGNOSIS — IMO0002 Reserved for concepts with insufficient information to code with codable children: Secondary | ICD-10-CM | POA: Insufficient documentation

## 2012-12-15 DIAGNOSIS — F1210 Cannabis abuse, uncomplicated: Secondary | ICD-10-CM | POA: Insufficient documentation

## 2012-12-15 DIAGNOSIS — Z8659 Personal history of other mental and behavioral disorders: Secondary | ICD-10-CM | POA: Insufficient documentation

## 2012-12-15 DIAGNOSIS — H9190 Unspecified hearing loss, unspecified ear: Secondary | ICD-10-CM | POA: Insufficient documentation

## 2012-12-15 DIAGNOSIS — Z87828 Personal history of other (healed) physical injury and trauma: Secondary | ICD-10-CM | POA: Insufficient documentation

## 2012-12-15 MED ORDER — DIAZEPAM 5 MG PO TABS: 5.0000 mg | ORAL_TABLET | Freq: Two times a day (BID) | ORAL | Status: DC

## 2012-12-15 MED ORDER — HYDROCODONE-ACETAMINOPHEN 5-325 MG PO TABS: ORAL_TABLET | ORAL | Status: DC
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
## 2012-12-18 NOTE — ED Provider Notes (Signed)
Medical screening examination/treatment/procedure(s) were performed by non-physician practitioner and as supervising physician I was immediately available for consultation/collaboration.   Alan Davidson III, MD 12/18/12 1131 

## 2012-12-26 ENCOUNTER — Emergency Department (HOSPITAL_COMMUNITY)
Admission: EM | Admit: 2012-12-26 | Discharge: 2012-12-27 | Disposition: A | Discharge status: Home / Self Care | Payer: Self-pay | Attending: Emergency Medicine | Admitting: Emergency Medicine

## 2012-12-26 ENCOUNTER — Encounter (HOSPITAL_COMMUNITY): Payer: Self-pay | Admitting: *Deleted

## 2012-12-26 DIAGNOSIS — R197 Diarrhea, unspecified: Secondary | ICD-10-CM | POA: Insufficient documentation

## 2012-12-26 DIAGNOSIS — Z8659 Personal history of other mental and behavioral disorders: Secondary | ICD-10-CM | POA: Insufficient documentation

## 2012-12-26 DIAGNOSIS — Z8669 Personal history of other diseases of the nervous system and sense organs: Secondary | ICD-10-CM | POA: Insufficient documentation

## 2012-12-26 DIAGNOSIS — Z87820 Personal history of traumatic brain injury: Secondary | ICD-10-CM | POA: Insufficient documentation

## 2012-12-26 DIAGNOSIS — Z8719 Personal history of other diseases of the digestive system: Secondary | ICD-10-CM | POA: Insufficient documentation

## 2012-12-26 DIAGNOSIS — R112 Nausea with vomiting, unspecified: Secondary | ICD-10-CM

## 2012-12-26 DIAGNOSIS — F17200 Nicotine dependence, unspecified, uncomplicated: Secondary | ICD-10-CM | POA: Insufficient documentation

## 2012-12-26 MED ORDER — FAMOTIDINE 20 MG PO TABS: 20.0000 mg | ORAL_TABLET | Freq: Two times a day (BID) | ORAL | Status: DC

## 2012-12-26 MED ORDER — ONDANSETRON 4 MG PO TBDP
ORAL_TABLET | ORAL | Status: AC
  Filled 2012-12-26: qty 2

## 2012-12-26 MED ORDER — ONDANSETRON 8 MG PO TBDP: 8.0000 mg | ORAL_TABLET | Freq: Two times a day (BID) | ORAL | Status: DC | PRN

## 2012-12-26 MED ORDER — PROMETHAZINE HCL 25 MG PO TABS: 25.0000 mg | ORAL_TABLET | Freq: Four times a day (QID) | ORAL | Status: DC | PRN

## 2012-12-26 MED ORDER — FAMOTIDINE 20 MG PO TABS
20.0000 mg | ORAL_TABLET | Freq: Once | ORAL | Status: AC
  Administered 2012-12-26: 20 mg via ORAL
  Filled 2012-12-26: qty 1

## 2012-12-26 MED ORDER — ONDANSETRON 4 MG PO TBDP
8.0000 mg | ORAL_TABLET | Freq: Once | ORAL | Status: AC
  Administered 2012-12-26: 8 mg via ORAL

## 2012-12-26 MED ORDER — FAMOTIDINE IN NACL 20-0.9 MG/50ML-% IV SOLN: 20.0000 mg | Freq: Once | INTRAVENOUS | Status: DC

## 2012-12-26 MED ORDER — SODIUM CHLORIDE 0.9 % IV BOLUS (SEPSIS): 1000.0000 mL | Freq: Once | INTRAVENOUS | Status: DC

## 2012-12-26 NOTE — ED Notes (Signed)
Pt attempting to drink ginger ale. Pt has bag at bedside if nauseated.

## 2012-12-26 NOTE — ED Notes (Signed)
Pt states that he thinks he ate under cooked chicken last night at work. Pt states that he started having abdominal cramps at work and once he got home he started getting nauseated and vomited multiple times. Pt states that he is now just vomiting bile and dry heaving. Pt feels better after the ODT zofran in triage. Pt does have hx of chron's but denies diarrhea.

## 2012-12-26 NOTE — ED Notes (Signed)
Pt states "I ate some pizza and chicken wings last night and I woke up at 6 this morning throwing up".  Last vomited at 2 pm, diarrhea

## 2012-12-26 NOTE — ED Provider Notes (Signed)
History     CSN: [id number]  Arrival date & time 12/26/12  2005   First MD Initiated Contact with Patient 12/26/12 2102      Chief Complaint  Patient presents with  . Emesis    (Consider location/radiation/quality/duration/timing/severity/associated sxs/prior treatment) HPI Comments: Patient reports he ate some take-out food including chicken and pizza at about 11:45 PM last night just before he got off work. He began having some diffuse abdominal cramps and discomfort by 2 or 3 AM. By 5 AM he was having several episodes of nausea and vomiting up to 10 times. He reports some of this emesis looked a little dark and yellowish in color. He also had one episode of loose, nonbloody stool. He denies any fever or chills, no new medications, no obvious sick contacts. Patient denies any current abdominal pain now. He did affirm that he did have some cramps earlier. She denies drinking significant alcohol the recent past. He has a significant history of Crohn's disease, currently is on prednisone.  Patient is a 22 y.o. male presenting with vomiting. The history is provided by the patient.  Emesis Associated symptoms: diarrhea   Associated symptoms: no chills     Past Medical History  Diagnosis Date  . Hard of hearing   . Tics of organic origin   . Crohn's   . Brief psychotic disorder   . Weight loss   . Cerebral concussion     Past Surgical History  Procedure Laterality Date  . Knee surgery      Family History  Problem Relation Age of Onset  . Bipolar disorder Father   . Hypertension Mother   . Prostate cancer Paternal Grandfather   . Alcohol abuse Father   . Colon cancer Neg Hx     History  Substance Use Topics  . Smoking status: Current Every Day Smoker -- 0.50 packs/day    Types: Cigarettes  . Smokeless tobacco: Never Used  . Alcohol Use: Yes      Review of Systems  Constitutional: Positive for appetite change. Negative for fever and chills.  Respiratory: Negative  for shortness of breath.   Cardiovascular: Negative for chest pain.  Gastrointestinal: Positive for nausea, vomiting and diarrhea. Negative for blood in stool.       Positive for abdominal cramps  Genitourinary: Negative.   Neurological: Negative for dizziness, syncope and light-headedness.  All other systems reviewed and are negative.    Allergies  Ibuprofen  Home Medications   Current Outpatient Rx  Name  Route  Sig  Dispense  Refill  . HYDROcodone-acetaminophen (NORCO/VICODIN) 5-325 MG per tablet   Oral   Take 1-2 tablets by mouth every 6 (six) hours as needed for pain.         . predniSONE (DELTASONE) 5 MG tablet   Oral   Take 5 mg by mouth 2 (two) times daily.           . famotidine (PEPCID) 20 MG tablet   Oral   Take 1 tablet (20 mg total) by mouth 2 (two) times daily.   30 tablet   0   . ondansetron (ZOFRAN-ODT) 8 MG disintegrating tablet   Oral   Take 1 tablet (8 mg total) by mouth every 12 (twelve) hours as needed for nausea.   20 tablet   0   . promethazine (PHENERGAN) 25 MG tablet   Oral   Take 1 tablet (25 mg total) by mouth every 6 (six) hours as needed for nausea.  20 tablet   0     BP 121/62  Pulse 96  Temp(Src) 97.8 F (36.6 C) (Oral)  Resp 18  SpO2 97%  Physical Exam  Nursing note and vitals reviewed. Constitutional: He appears well-developed and well-nourished. No distress.  HENT:  Head: Normocephalic and atraumatic.  Eyes: EOM are normal. No scleral icterus.  Neck: Normal range of motion. Neck supple.  Cardiovascular: Normal rate and regular rhythm.   Pulmonary/Chest: Effort normal. No respiratory distress.  Abdominal: Soft. He exhibits no distension. There is no tenderness. There is no rebound and no guarding.  Neurological: He is alert. Coordination normal.  Skin: Skin is warm and dry. He is not diaphoretic.    ED Course  Procedures (including critical care time)  Labs Reviewed - No data to display No results  found.   1. Nausea vomiting and diarrhea     Room air saturation is 97% I interpret this to be normal.  11:40 PM Pt has been drinking fluids fine, feel improved.  No further N/V.  Pt is comfortable going home.  Reports this felt nothing like his prior Crohns' flares.    MDM   Patient received by mouth Zofran in triage prior to being placed in the room. The patient reports his cramps and nausea already improved. He is RE begun drinking some water and ice chips here and reports she is RE starting to feel improved. His abdomen is soft, there is no guarding or rebound denies any current tenderness. Doubtful that this is a Crohn's flare and instead may be related to food toxin related to what he had eaten last night. Patient is afebrile, normotensive, not tachycardic, not severely dehydrated by clinical exam and it steadily improving. Patient is given some by mouth fluids here, by mouth Pepcid and if he continues to improve without further vomiting, will discharge to home with prescriptions for nausea and followup with his gastroenterologist as needed.        Gavin Pound. Oletta Lamas, MD 12/26/12 [phone]

## 2012-12-26 NOTE — Discharge Instructions (Signed)
Nausea and Vomiting Nausea is a sick feeling that often comes before throwing up (vomiting). Vomiting is a reflex where stomach contents come out of your mouth. Vomiting can cause severe loss of body fluids (dehydration). Children and elderly adults can become dehydrated quickly, especially if they also have diarrhea. Nausea and vomiting are symptoms of a condition or disease. It is important to find the cause of your symptoms. CAUSES   Direct irritation of the stomach lining. This irritation can result from increased acid production (gastroesophageal reflux disease), infection, food poisoning, taking certain medicines (such as nonsteroidal anti-inflammatory drugs), alcohol use, or tobacco use.  Signals from the brain.These signals could be caused by a headache, heat exposure, an inner ear disturbance, increased pressure in the brain from injury, infection, a tumor, or a concussion, pain, emotional stimulus, or metabolic problems.  An obstruction in the gastrointestinal tract (bowel obstruction).  Illnesses such as diabetes, hepatitis, gallbladder problems, appendicitis, kidney problems, cancer, sepsis, atypical symptoms of a heart attack, or eating disorders.  Medical treatments such as chemotherapy and radiation.  Receiving medicine that makes you sleep (general anesthetic) during surgery. DIAGNOSIS Your caregiver may ask for tests to be done if the problems do not improve after a few days. Tests may also be done if symptoms are severe or if the reason for the nausea and vomiting is not clear. Tests may include:  Urine tests.  Blood tests.  Stool tests.  Cultures (to look for evidence of infection).  X-rays or other imaging studies. Test results can help your caregiver make decisions about treatment or the need for additional tests. TREATMENT You need to stay well hydrated. Drink frequently but in small amounts.You may wish to drink water, sports drinks, clear broth, or eat frozen  ice pops or gelatin dessert to help stay hydrated.When you eat, eating slowly may help prevent nausea.There are also some antinausea medicines that may help prevent nausea. HOME CARE INSTRUCTIONS   Take all medicine as directed by your caregiver.  If you do not have an appetite, do not force yourself to eat. However, you must continue to drink fluids.  If you have an appetite, eat a normal diet unless your caregiver tells you differently.  Eat a variety of complex carbohydrates (rice, wheat, potatoes, bread), lean meats, yogurt, fruits, and vegetables.  Avoid high-fat foods because they are more difficult to digest.  Drink enough water and fluids to keep your urine clear or pale yellow.  If you are dehydrated, ask your caregiver for specific rehydration instructions. Signs of dehydration may include:  Severe thirst.  Dry lips and mouth.  Dizziness.  Dark urine.  Decreasing urine frequency and amount.  Confusion.  Rapid breathing or pulse. SEEK IMMEDIATE MEDICAL CARE IF:   You have blood or brown flecks (like coffee grounds) in your vomit.  You have black or bloody stools.  You have a severe headache or stiff neck.  You are confused.  You have severe abdominal pain.  You have chest pain or trouble breathing.  You do not urinate at least once every 8 hours.  You develop cold or clammy skin.  You continue to vomit for longer than 24 to 48 hours.  You have a fever. MAKE SURE YOU:   Understand these instructions.  Will watch your condition.  Will get help right away if you are not doing well or get worse. Document Released: 09/23/2005 Document Revised: 12/16/2011 Document Reviewed: 02/20/2011 [employer] Patient Information 2013 [employer].  

## 2013-05-05 ENCOUNTER — Emergency Department (HOSPITAL_COMMUNITY)
Admission: EM | Admit: 2013-05-05 | Discharge: 2013-05-05 | Disposition: A | Discharge status: Home / Self Care | Payer: Self-pay | Attending: Emergency Medicine | Admitting: Emergency Medicine

## 2013-05-05 ENCOUNTER — Emergency Department (HOSPITAL_COMMUNITY): Payer: Self-pay

## 2013-05-05 DIAGNOSIS — W230XXA Caught, crushed, jammed, or pinched between moving objects, initial encounter: Secondary | ICD-10-CM | POA: Insufficient documentation

## 2013-05-05 DIAGNOSIS — S62501A Fracture of unspecified phalanx of right thumb, initial encounter for closed fracture: Secondary | ICD-10-CM

## 2013-05-05 DIAGNOSIS — R51 Headache: Secondary | ICD-10-CM | POA: Insufficient documentation

## 2013-05-05 DIAGNOSIS — Z888 Allergy status to other drugs, medicaments and biological substances status: Secondary | ICD-10-CM | POA: Insufficient documentation

## 2013-05-05 DIAGNOSIS — Z79899 Other long term (current) drug therapy: Secondary | ICD-10-CM | POA: Insufficient documentation

## 2013-05-05 DIAGNOSIS — F489 Nonpsychotic mental disorder, unspecified: Secondary | ICD-10-CM | POA: Insufficient documentation

## 2013-05-05 DIAGNOSIS — F17200 Nicotine dependence, unspecified, uncomplicated: Secondary | ICD-10-CM | POA: Insufficient documentation

## 2013-05-05 DIAGNOSIS — Y92009 Unspecified place in unspecified non-institutional (private) residence as the place of occurrence of the external cause: Secondary | ICD-10-CM | POA: Insufficient documentation

## 2013-05-05 DIAGNOSIS — Y939 Activity, unspecified: Secondary | ICD-10-CM | POA: Insufficient documentation

## 2013-05-05 DIAGNOSIS — K5090 Crohn's disease, unspecified, without complications: Secondary | ICD-10-CM | POA: Insufficient documentation

## 2013-05-05 DIAGNOSIS — H9190 Unspecified hearing loss, unspecified ear: Secondary | ICD-10-CM | POA: Insufficient documentation

## 2013-05-05 DIAGNOSIS — G2569 Other tics of organic origin: Secondary | ICD-10-CM | POA: Insufficient documentation

## 2013-05-05 DIAGNOSIS — IMO0002 Reserved for concepts with insufficient information to code with codable children: Secondary | ICD-10-CM | POA: Insufficient documentation

## 2013-05-05 MED ORDER — HYDROCODONE-ACETAMINOPHEN 5-325 MG PO TABS: ORAL_TABLET | ORAL | Status: DC

## 2013-05-05 MED ORDER — HYDROCODONE-ACETAMINOPHEN 5-325 MG PO TABS
2.0000 | ORAL_TABLET | Freq: Once | ORAL | Status: AC
  Administered 2013-05-05: 2 via ORAL
  Filled 2013-05-05: qty 2

## 2013-05-05 MED ORDER — ONDANSETRON HCL 4 MG PO TABS
4.0000 mg | ORAL_TABLET | Freq: Once | ORAL | Status: DC
  Filled 2013-05-05: qty 1

## 2013-05-05 NOTE — ED Notes (Signed)
[name] is a 22 y.o. male Who presents with  Chief Complaint  Patient presents with  . Hand Injury   R Hand Thumb pain/swelling s/p injury. Closed thumb in home door last night approximately 1900. No medications today. Rates pain as 9/10. Unable to move thumb. PMS intact.

## 2013-05-05 NOTE — Progress Notes (Signed)
Orthopedic Tech Progress Note Patient Details:  [name] 06-06-1991 [id number]  Ortho Devices Type of Ortho Device: Arm sling;Thumb spica splint Splint Material: Plaster Ortho Device/Splint Interventions: Application   Cammer, Mickie Bail 05/05/2013, 9:52 AM

## 2013-05-05 NOTE — ED Provider Notes (Signed)
CSN: [id number]     Arrival date & time 05/05/13  4403 History     First MD Initiated Contact with Patient 05/05/13 [phone]     Chief Complaint  Patient presents with  . Hand Injury   (Consider location/radiation/quality/duration/timing/severity/associated sxs/prior Treatment) Patient is a 22 y.o. male presenting with hand injury. The history is provided by the patient.  Hand Injury Location:  Hand Time since incident:  1 day Injury: yes   Mechanism of injury comment:  Closed the right hand in a door at home Hand location:  R hand Pain details:    Quality:  Throbbing and aching   Radiates to:  R arm   Severity:  Moderate   Onset quality:  Sudden   Timing:  Constant   Progression:  Worsening Chronicity:  New Handedness:  Right-handed Dislocation: no   Foreign body present:  No foreign bodies Prior injury to area:  No Relieved by:  Nothing Worsened by:  Movement Ineffective treatments:  None tried Associated symptoms: stiffness and swelling   Associated symptoms: no back pain, no neck pain and no numbness     Past Medical History  Diagnosis Date  . Hard of hearing   . Tics of organic origin   . Crohn's   . Brief psychotic disorder   . Weight loss   . Cerebral concussion    Past Surgical History  Procedure Laterality Date  . Knee surgery     Family History  Problem Relation Age of Onset  . Bipolar disorder Father   . Hypertension Mother   . Prostate cancer Paternal Grandfather   . Alcohol abuse Father   . Colon cancer Neg Hx    History  Substance Use Topics  . Smoking status: Current Every Day Smoker -- 0.50 packs/day    Types: Cigarettes  . Smokeless tobacco: Never Used  . Alcohol Use: Yes    Review of Systems  Constitutional: Negative for activity change.       All ROS Neg except as noted in HPI  HENT: Positive for hearing loss. Negative for nosebleeds and neck pain.   Eyes: Negative for photophobia and discharge.  Respiratory: Negative for cough,  shortness of breath and wheezing.   Cardiovascular: Negative for chest pain and palpitations.  Gastrointestinal: Negative for abdominal pain and blood in stool.  Genitourinary: Negative for dysuria, frequency and hematuria.  Musculoskeletal: Positive for arthralgias and stiffness. Negative for back pain.  Skin: Negative.   Neurological: Positive for headaches. Negative for dizziness, seizures and speech difficulty.  Psychiatric/Behavioral: Negative for hallucinations and confusion.    Allergies  Ibuprofen  Home Medications   Current Outpatient Rx  Name  Route  Sig  Dispense  Refill  . famotidine (PEPCID) 20 MG tablet   Oral   Take 1 tablet (20 mg total) by mouth 2 (two) times daily.   30 tablet   0   . HYDROcodone-acetaminophen (NORCO/VICODIN) 5-325 MG per tablet   Oral   Take 1-2 tablets by mouth every 6 (six) hours as needed for pain.         Marland Kitchen ondansetron (ZOFRAN-ODT) 8 MG disintegrating tablet   Oral   Take 1 tablet (8 mg total) by mouth every 12 (twelve) hours as needed for nausea.   20 tablet   0   . predniSONE (DELTASONE) 5 MG tablet   Oral   Take 5 mg by mouth 2 (two) times daily.           . promethazine (  PHENERGAN) 25 MG tablet   Oral   Take 1 tablet (25 mg total) by mouth every 6 (six) hours as needed for nausea.   20 tablet   0    BP 123/83  Pulse 90  Temp(Src) 97.1 F (36.2 C)  SpO2 100% Physical Exam  Nursing note and vitals reviewed. Constitutional: He is oriented to person, place, and time. He appears well-developed and well-nourished.  Non-toxic appearance.  HENT:  Head: Normocephalic.  Right Ear: Tympanic membrane and external ear normal.  Left Ear: Tympanic membrane and external ear normal.  Eyes: EOM and lids are normal. Pupils are equal, round, and reactive to light.  Neck: Normal range of motion. Neck supple. Carotid bruit is not present.  Cardiovascular: Normal rate, regular rhythm, normal heart sounds, intact distal pulses and  normal pulses.   Pulmonary/Chest: Breath sounds normal. No respiratory distress.  Abdominal: Soft. Bowel sounds are normal. There is no tenderness. There is no guarding.  Musculoskeletal: Normal range of motion.  Pain and swelling of the right thumb. Limited ROM due to pain. No evidence for dislocation. Good cap refill. No subungual hematoma noted. FROM of all other fingers of the right hand. FROM of the right wrist and shoulder.  Lymphadenopathy:       Head (right side): No submandibular adenopathy present.       Head (left side): No submandibular adenopathy present.    He has no cervical adenopathy.  Neurological: He is alert and oriented to person, place, and time. He has normal strength. No cranial nerve deficit or sensory deficit.  Skin: Skin is warm and dry.  Psychiatric: He has a normal mood and affect. His speech is normal.    ED Course   Procedures (including critical care time)  Labs Reviewed - No data to display No results found. No diagnosis found.  MDM  *I have reviewed nursing notes, vital signs, and all appropriate lab and imaging results for this patient.** Patient presents to the emergency department with complaint of right thumb pain. He states that the [facility] in a door on last evening approximately 7 PM. The patient rated his pain as a 9/10.  The examination reveals decrease in range of motion do to pain, but no gross neurovascular deficits appreciated. An x-ray of the thumb (right) reveals a mildly displaced fracture involving the proximal base of the first distal phalanx.  Thumb spica splint has been ordered for the patient along with a sling and ice pack. The patient will see the orthopedic hand specialist in the office for followup and management of this problem. Prescription for Norco one or 2 tablets every 4 hours #20 given to the patient.  Kathie Dike, PA-C 05/05/13 1053

## 2013-05-05 NOTE — ED Provider Notes (Signed)
Medical screening examination/treatment/procedure(s) were conducted as a shared visit with non-physician practitioner(s) or resident  and myself.  I personally evaluated the patient during the encounter and agree with the findings and plan unless otherwise indicated.  Isolated fx.  Xray reviewed.  Fup with ortho discussed with pt.   Enid Skeens, MD 05/05/13 [phone]

## 2013-08-24 ENCOUNTER — Encounter (HOSPITAL_COMMUNITY): Payer: Self-pay | Admitting: Emergency Medicine

## 2013-08-24 ENCOUNTER — Emergency Department (HOSPITAL_COMMUNITY)
Admission: EM | Admit: 2013-08-24 | Discharge: 2013-08-24 | Disposition: A | Discharge status: Home / Self Care | Payer: Self-pay | Attending: Emergency Medicine | Admitting: Emergency Medicine

## 2013-08-24 DIAGNOSIS — F17200 Nicotine dependence, unspecified, uncomplicated: Secondary | ICD-10-CM | POA: Insufficient documentation

## 2013-08-24 DIAGNOSIS — Z8659 Personal history of other mental and behavioral disorders: Secondary | ICD-10-CM | POA: Insufficient documentation

## 2013-08-24 DIAGNOSIS — R51 Headache: Secondary | ICD-10-CM | POA: Insufficient documentation

## 2013-08-24 DIAGNOSIS — Z8719 Personal history of other diseases of the digestive system: Secondary | ICD-10-CM | POA: Insufficient documentation

## 2013-08-24 DIAGNOSIS — H9190 Unspecified hearing loss, unspecified ear: Secondary | ICD-10-CM | POA: Insufficient documentation

## 2013-08-24 DIAGNOSIS — R519 Headache, unspecified: Secondary | ICD-10-CM

## 2013-08-24 DIAGNOSIS — Z8669 Personal history of other diseases of the nervous system and sense organs: Secondary | ICD-10-CM | POA: Insufficient documentation

## 2013-08-24 MED ORDER — ACETAMINOPHEN 500 MG PO TABS
1000.0000 mg | ORAL_TABLET | Freq: Once | ORAL | Status: AC
  Administered 2013-08-24: 1000 mg via ORAL
  Filled 2013-08-24: qty 2

## 2013-08-24 MED ORDER — SODIUM CHLORIDE 0.9 % IV BOLUS (SEPSIS)
1000.0000 mL | Freq: Once | INTRAVENOUS | Status: AC
  Administered 2013-08-24: 1000 mL via INTRAVENOUS

## 2013-08-24 MED ORDER — DEXAMETHASONE SODIUM PHOSPHATE 10 MG/ML IJ SOLN
10.0000 mg | Freq: Once | INTRAMUSCULAR | Status: AC
  Administered 2013-08-24: 10 mg via INTRAVENOUS
  Filled 2013-08-24: qty 1

## 2013-08-24 MED ORDER — DIPHENHYDRAMINE HCL 50 MG/ML IJ SOLN
12.5000 mg | Freq: Once | INTRAMUSCULAR | Status: AC
  Administered 2013-08-24: 12.5 mg via INTRAVENOUS
  Filled 2013-08-24: qty 1

## 2013-08-24 MED ORDER — BUTALBITAL-APAP-CAFFEINE 50-325-40 MG PO TABS: 1.0000 | ORAL_TABLET | Freq: Four times a day (QID) | ORAL | Status: DC | PRN

## 2013-08-24 MED ORDER — PROCHLORPERAZINE EDISYLATE 5 MG/ML IJ SOLN
10.0000 mg | Freq: Once | INTRAMUSCULAR | Status: AC
  Administered 2013-08-24: 10 mg via INTRAVENOUS
  Filled 2013-08-24: qty 2

## 2013-08-24 MED ORDER — ACETAMINOPHEN 500 MG PO TABS: 1000.0000 mg | ORAL_TABLET | Freq: Once | ORAL | Status: DC

## 2013-08-24 NOTE — ED Provider Notes (Signed)
CSN: [id number]     Arrival date & time 08/24/13  1424 History   First MD Initiated Contact with Patient 08/24/13 1523     Chief Complaint  Patient presents with  . Headache   (Consider location/radiation/quality/duration/timing/severity/associated sxs/prior Treatment) HPI  [name] is a 22 y.o. male with past medical history significant for Crohn's disease complaining of headache worsening over the course of the week. Patient describes it as sharp, intermittent, slowly building to a maximal intensity of 8 over the course of 7 days. He denies fever, cervicalgia, ALOC. Patient describes the pain as frontal and occipital. Cannot identify any exacerbating or alleviating factors. Patient has not taken any pain medication prior to arrival. Pt denies rhinorrhea. Patient states he does not normally get headaches.  Past Medical History  Diagnosis Date  . Hard of hearing   . Tics of organic origin   . Crohn's   . Brief psychotic disorder   . Weight loss   . Cerebral concussion    Past Surgical History  Procedure Laterality Date  . Knee surgery     Family History  Problem Relation Age of Onset  . Bipolar disorder Father   . Hypertension Mother   . Prostate cancer Paternal Grandfather   . Alcohol abuse Father   . Colon cancer Neg Hx    History  Substance Use Topics  . Smoking status: Current Every Day Smoker -- 0.50 packs/day    Types: Cigarettes  . Smokeless tobacco: Never Used  . Alcohol Use: Yes    Review of Systems 10 systems reviewed and found to be negative, except as noted in the HPI   Allergies  Ibuprofen  Home Medications   Current Outpatient Rx  Name  Route  Sig  Dispense  Refill  . butalbital-acetaminophen-caffeine (FIORICET) 50-325-40 MG per tablet   Oral   Take 1-2 tablets by mouth every 6 (six) hours as needed for headache.   20 tablet   0    BP 91/52  Pulse 66  Temp(Src) 97.4 F (36.3 C) (Oral)  Resp 16  Wt 137 lb 12.8 oz (62.506 kg)  SpO2  99% Physical Exam  Nursing note and vitals reviewed. Constitutional: He is oriented to person, place, and time. He appears well-developed and well-nourished. No distress.  HENT:  Head: Normocephalic.  Mouth/Throat: Oropharynx is clear and moist.  Eyes: Conjunctivae and EOM are normal. Pupils are equal, round, and reactive to light.  Neck: Normal range of motion.  Patient has full range of motion to neck. No tenderness to deep palpation of the posterior cervical spine. Patient can flex chin to chest with no pain.   Cardiovascular: Normal rate, regular rhythm and intact distal pulses.   Pulmonary/Chest: Effort normal and breath sounds normal. No stridor. No respiratory distress. He has no wheezes. He has no rales. He exhibits no tenderness.  Abdominal: Soft.  Musculoskeletal: Normal range of motion.  Neurological: He is alert and oriented to person, place, and time.  Cranial nerves III through XII intact, strength 5 out of 5x4 extremities, negative pronator drift, finger to nose and heel-to-shin coordinated, sensation intact to pinprick and light touch, gait is coordinated and Romberg is negative.    Psychiatric: He has a normal mood and affect.    ED Course  Procedures (including critical care time) Labs Review Labs Reviewed - No data to display Imaging Review No results found.  EKG Interpretation   None       7:06 PM patient seen and  evaluated at the bedside, fluids are running, patient has received his Compazine, Benadryl and Decadron. He states his headache is resolving.  MDM   1. Headache      Filed Vitals:   08/24/13 1427 08/24/13 1718  BP: 112/71 91/52  Pulse: 89 66  Temp: 98.1 F (36.7 C) 97.4 F (36.3 C)  TempSrc: Oral Oral  Resp: 18 16  Weight: 137 lb 12.8 oz (62.506 kg)   SpO2: 96% 99%     [name] is a 22 y.o. male Pt. HA treated and improved while in ED.  Presentation inon concerning for [facility], [facility], Meningitis, or temporal arteritis. Pt is  afebrile with no focal neuro deficits, nuchal rigidity, or change in vision. Pt verbalizes understanding and is agreeable with plan to dc.   Medications  sodium chloride 0.9 % bolus 1,000 mL (1,000 mLs Intravenous New Bag/Given 08/24/13 1643)  prochlorperazine (COMPAZINE) injection 10 mg (10 mg Intravenous Given 08/24/13 1643)  diphenhydrAMINE (BENADRYL) injection 12.5 mg (12.5 mg Intravenous Given 08/24/13 1643)  dexamethasone (DECADRON) injection 10 mg (10 mg Intravenous Given 08/24/13 1643)  acetaminophen (TYLENOL) tablet 1,000 mg (1,000 mg Oral Given 08/24/13 1816)    Pt is hemodynamically stable, appropriate for, and amenable to discharge at this time. Pt verbalized understanding and agrees with care plan. All questions answered. Outpatient follow-up and specific return precautions discussed.    Discharge Medication List as of 08/24/2013  5:53 PM    START taking these medications   Details  butalbital-acetaminophen-caffeine (FIORICET) 50-325-40 MG per tablet Take 1-2 tablets by mouth every 6 (six) hours as needed for headache., Starting 08/24/2013, Until Wed 08/24/14, Print        Note: Portions of this report may have been transcribed using voice recognition software. Every effort was made to ensure accuracy; however, inadvertent computerized transcription errors may be present      Wynetta Emery, PA-C 08/24/13 1908

## 2013-08-24 NOTE — ED Provider Notes (Signed)
Medical screening examination/treatment/procedure(s) were performed by non-physician practitioner and as supervising physician I was immediately available for consultation/collaboration.  Criag Wicklund T Costa Jha, MD 08/24/13 2317 

## 2013-08-24 NOTE — ED Notes (Signed)
H/a for 2 days states it mostly sinus but it rads to back of head. Denies any injury

## 2013-08-27 ENCOUNTER — Emergency Department (HOSPITAL_COMMUNITY): Payer: Self-pay

## 2013-08-27 ENCOUNTER — Encounter (HOSPITAL_COMMUNITY): Payer: Self-pay | Admitting: Emergency Medicine

## 2013-08-27 ENCOUNTER — Emergency Department (HOSPITAL_COMMUNITY)
Admission: EM | Admit: 2013-08-27 | Discharge: 2013-08-27 | Disposition: A | Discharge status: Home / Self Care | Payer: Self-pay | Attending: Emergency Medicine | Admitting: Emergency Medicine

## 2013-08-27 DIAGNOSIS — M25572 Pain in left ankle and joints of left foot: Secondary | ICD-10-CM

## 2013-08-27 DIAGNOSIS — Z87828 Personal history of other (healed) physical injury and trauma: Secondary | ICD-10-CM | POA: Insufficient documentation

## 2013-08-27 DIAGNOSIS — F17200 Nicotine dependence, unspecified, uncomplicated: Secondary | ICD-10-CM | POA: Insufficient documentation

## 2013-08-27 DIAGNOSIS — Z8659 Personal history of other mental and behavioral disorders: Secondary | ICD-10-CM | POA: Insufficient documentation

## 2013-08-27 DIAGNOSIS — M25579 Pain in unspecified ankle and joints of unspecified foot: Secondary | ICD-10-CM | POA: Insufficient documentation

## 2013-08-27 DIAGNOSIS — Z8669 Personal history of other diseases of the nervous system and sense organs: Secondary | ICD-10-CM | POA: Insufficient documentation

## 2013-08-27 DIAGNOSIS — M25473 Effusion, unspecified ankle: Secondary | ICD-10-CM | POA: Insufficient documentation

## 2013-08-27 DIAGNOSIS — Z8719 Personal history of other diseases of the digestive system: Secondary | ICD-10-CM | POA: Insufficient documentation

## 2013-08-27 DIAGNOSIS — M25476 Effusion, unspecified foot: Secondary | ICD-10-CM | POA: Insufficient documentation

## 2013-08-27 MED ORDER — HYDROCODONE-ACETAMINOPHEN 5-325 MG PO TABS: 1.0000 | ORAL_TABLET | ORAL | Status: DC | PRN

## 2013-08-27 MED ORDER — HYDROCODONE-ACETAMINOPHEN 5-325 MG PO TABS
1.0000 | ORAL_TABLET | Freq: Once | ORAL | Status: AC
  Administered 2013-08-27: 1 via ORAL
  Filled 2013-08-27: qty 1

## 2013-08-27 NOTE — Progress Notes (Signed)
Orthopedic Tech Progress Note Patient Details:  [name] September 20, 1991 [id number]  Ortho Devices Type of Ortho Device: ASO;Crutches Ortho Device/Splint Interventions: Application   Cammer, Mickie Bail 08/27/2013, 12:21 PM

## 2013-08-27 NOTE — ED Provider Notes (Signed)
CSN: [id number]     Arrival date & time 08/27/13  0347 History  This chart was scribed for non-physician practitioner, Trixie Dredge, PA-C, working with Laray Anger, DO by Shari Heritage, ED Scribe. This patient was seen in room TR05C/TR05C and the patient's care was started at 10:28 AM.    Chief Complaint  Patient presents with  . Ankle Pain    The history is provided by the patient. No language interpreter was used.    HPI Comments: [name] is a 22 y.o. male who presents to the Emergency Department complaining of constant, throbbing left ankle pain with associated swelling onset 2 days ago. He states that upon waking, he noticed pain and swelling to his ankle. He further reports that when he tried to stand up from his bed to walk, he was unable to ambulate and fell to the floor due to the pain. He denies any known injury or trauma to his ankle. He denies extremity weakness, fever, abdominal pain, nausea, vomiting or diarrhea. He denies penile discharge or testicular swelling.Denies any other joint pain or swelling or history of joint problems. He has not been treated for any STDs recently. He denies a prior history of joint or bone problems. Patient has a history of Chron's disease. He is not currently being treated with medication for Chron's and states that his disease is well controlled. He reports no other chronic medical conditions. He is a current every day smoker. He has no PCP.  Past Medical History  Diagnosis Date  . Hard of hearing   . Tics of organic origin   . Crohn's   . Brief psychotic disorder   . Weight loss   . Cerebral concussion    Past Surgical History  Procedure Laterality Date  . Knee surgery     Family History  Problem Relation Age of Onset  . Bipolar disorder Father   . Hypertension Mother   . Prostate cancer Paternal Grandfather   . Alcohol abuse Father   . Colon cancer Neg Hx    History  Substance Use Topics  . Smoking status: Current Every  Day Smoker -- 0.50 packs/day    Types: Cigarettes  . Smokeless tobacco: Never Used  . Alcohol Use: Yes    Review of Systems  Constitutional: Negative for fever.  Gastrointestinal: Negative for nausea, vomiting, abdominal pain and diarrhea.  Genitourinary: Negative for discharge and testicular pain.  Musculoskeletal: Positive for arthralgias.  Neurological: Negative for weakness.    Allergies  Ibuprofen  Home Medications   Current Outpatient Rx  Name  Route  Sig  Dispense  Refill  . butalbital-acetaminophen-caffeine (FIORICET) 50-325-40 MG per tablet   Oral   Take 1-2 tablets by mouth every 6 (six) hours as needed for headache.   20 tablet   0    Triage Vitals: BP 104/75  Pulse 52  Temp(Src) 97.3 F (36.3 C) (Oral)  Resp 16  Ht 6' (1.829 m)  Wt 143 lb 3.2 oz (64.955 kg)  BMI 19.42 kg/m2  SpO2 100% Physical Exam  Nursing note and vitals reviewed. Constitutional: He appears well-developed and well-nourished. No distress.  HENT:  Head: Normocephalic and atraumatic.  Neck: Neck supple.  Pulmonary/Chest: Effort normal.  Musculoskeletal:  Left ankle: Left lateral ankle edematous. Tender to palpation. Decreased active range of motion secondary to pain. No erythema. No warmth. Digits with full active range of motion. Sensation intact. Cap refills less than 2 seconds. Distal pulses intact  Left calf soft  and nontender.  Left lower leg nontender.  Neurological: He is alert.  Skin: He is not diaphoretic.    ED Course  Procedures (including critical care time) DIAGNOSTIC STUDIES: Oxygen Saturation is 100% on room air, normal by my interpretation.    COORDINATION OF CARE: 10:32 AM- X-ray of left ankle is negative for fracture or dislocation. Will provide a brace and crutches as well as pain medicine. I will also refer to orthopedics for follow up. Patient informed of current plan for treatment and evaluation and agrees with plan at this time.    Imaging Review Dg  Ankle Complete Left  08/27/2013   CLINICAL DATA:  Lateral ankle swelling.  Known known injury.  EXAM: LEFT ANKLE COMPLETE - 3+ VIEW  COMPARISON:  None.  FINDINGS: No fracture or dislocation.  No significant degenerative changes detected.  Minimal plantar spur.  No plain film evidence of osteomyelitis.  IMPRESSION: Minimal plantar spur.  No fracture or dislocation.  No plain film evidence of osteomyelitis.   Electronically Signed   By: Bridgett Larsson M.D.   On: 08/27/2013 10:14     MDM   1. Left lateral ankle pain    Patient with left lateral ankle swelling and pain.  No known injury.  No erythema or warmth.  No fevers.  Pt is well appearing.  Crohn's well controlled. Not on any medications currently for crohn's including prednisone. ASO, crutches, RICE, norco #15.  Discussed result, findings, treatment, and follow up  with patient.  Pt given return precautions.  Pt verbalizes understanding and agrees with plan.      I doubt any other EMC precluding discharge at this time including, but not necessarily limited to the following: septic joint, gout, reactive arthritis  I personally performed the services described in this documentation, which was scribed in my presence. The recorded information has been reviewed and is accurate.   Trixie Dredge, PA-C 08/27/13 1046

## 2013-08-27 NOTE — ED Notes (Signed)
Pt states he woke this am with left ankle pain and swelling. No known injury. Pt has hx of Crohn's Dz.

## 2013-08-28 NOTE — ED Provider Notes (Signed)
Medical screening examination/treatment/procedure(s) were performed by non-physician practitioner and as supervising physician I was immediately available for consultation/collaboration.  EKG Interpretation   None         Laray Anger, DO 08/28/13 1042

## 2013-10-24 ENCOUNTER — Encounter (HOSPITAL_COMMUNITY): Payer: Self-pay | Admitting: Emergency Medicine

## 2013-10-24 ENCOUNTER — Emergency Department (HOSPITAL_COMMUNITY)
Admission: EM | Admit: 2013-10-24 | Discharge: 2013-10-24 | Disposition: A | Discharge status: Home / Self Care | Payer: Self-pay | Attending: Emergency Medicine | Admitting: Emergency Medicine

## 2013-10-24 ENCOUNTER — Emergency Department (HOSPITAL_COMMUNITY): Payer: Self-pay

## 2013-10-24 DIAGNOSIS — F1210 Cannabis abuse, uncomplicated: Secondary | ICD-10-CM | POA: Insufficient documentation

## 2013-10-24 DIAGNOSIS — F12929 Cannabis use, unspecified with intoxication, unspecified: Secondary | ICD-10-CM

## 2013-10-24 DIAGNOSIS — Z8719 Personal history of other diseases of the digestive system: Secondary | ICD-10-CM | POA: Insufficient documentation

## 2013-10-24 DIAGNOSIS — F17200 Nicotine dependence, unspecified, uncomplicated: Secondary | ICD-10-CM | POA: Insufficient documentation

## 2013-10-24 DIAGNOSIS — Z8669 Personal history of other diseases of the nervous system and sense organs: Secondary | ICD-10-CM | POA: Insufficient documentation

## 2013-10-24 DIAGNOSIS — F10929 Alcohol use, unspecified with intoxication, unspecified: Secondary | ICD-10-CM

## 2013-10-24 DIAGNOSIS — Z880 Allergy status to penicillin: Secondary | ICD-10-CM | POA: Insufficient documentation

## 2013-10-24 DIAGNOSIS — R4182 Altered mental status, unspecified: Secondary | ICD-10-CM | POA: Insufficient documentation

## 2013-10-24 DIAGNOSIS — F1010 Alcohol abuse, uncomplicated: Secondary | ICD-10-CM | POA: Insufficient documentation

## 2013-10-24 LAB — URINALYSIS, ROUTINE W REFLEX MICROSCOPIC
BILIRUBIN URINE: NEGATIVE
Glucose, UA: NEGATIVE mg/dL
HGB URINE DIPSTICK: NEGATIVE
KETONES UR: NEGATIVE mg/dL
Leukocytes, UA: NEGATIVE
NITRITE: NEGATIVE
PH: 6 (ref 5.0–8.0)
Protein, ur: NEGATIVE mg/dL
Specific Gravity, Urine: 1.025 (ref 1.005–1.030)
Urobilinogen, UA: 0.2 mg/dL (ref 0.0–1.0)

## 2013-10-24 LAB — COMPREHENSIVE METABOLIC PANEL
ALK PHOS: 68 U/L (ref 39–117)
ALT: 9 U/L (ref 0–53)
AST: 18 U/L (ref 0–37)
Albumin: 3.3 g/dL — ABNORMAL LOW (ref 3.5–5.2)
BUN: 10 mg/dL (ref 6–23)
CO2: 23 mEq/L (ref 19–32)
Calcium: 8.6 mg/dL (ref 8.4–10.5)
Chloride: 105 mEq/L (ref 96–112)
Creatinine, Ser: 0.71 mg/dL (ref 0.50–1.35)
GFR calc Af Amer: >90 mL/min (ref >90)
GFR calc non Af Amer: >90 mL/min (ref >90)
Glucose, Bld: 75 mg/dL (ref 70–99)
Potassium: 3.6 mEq/L — ABNORMAL LOW (ref 3.7–5.3)
SODIUM: 144 meq/L (ref 137–147)
TOTAL PROTEIN: 7 g/dL (ref 6.0–8.3)
Total Bilirubin: <0.2 mg/dL — ABNORMAL LOW (ref 0.3–1.2)

## 2013-10-24 LAB — RAPID URINE DRUG SCREEN, HOSP PERFORMED
Amphetamines: NOT DETECTED
BARBITURATES: NOT DETECTED
BENZODIAZEPINES: NOT DETECTED
Cocaine: NOT DETECTED
Opiates: NOT DETECTED
TETRAHYDROCANNABINOL: POSITIVE — AB

## 2013-10-24 LAB — CBC WITH DIFFERENTIAL/PLATELET
BASOS ABS: 0 10*3/uL (ref 0.0–0.1)
Basophils Relative: 1 % (ref 0–1)
EOS PCT: 6 % — AB (ref 0–5)
Eosinophils Absolute: 0.3 10*3/uL (ref 0.0–0.7)
HCT: 42.6 % (ref 39.0–52.0)
Hemoglobin: 14.2 g/dL (ref 13.0–17.0)
Lymphocytes Relative: 19 % (ref 12–46)
Lymphs Abs: 0.9 10*3/uL (ref 0.7–4.0)
MCH: 28.4 pg (ref 26.0–34.0)
MCHC: 33.3 g/dL (ref 30.0–36.0)
MCV: 85.2 fL (ref 78.0–100.0)
Monocytes Absolute: 0.5 10*3/uL (ref 0.1–1.0)
Monocytes Relative: 10 % (ref 3–12)
Neutro Abs: 3.1 10*3/uL (ref 1.7–7.7)
Neutrophils Relative %: 65 % (ref 43–77)
Platelets: 192 10*3/uL (ref 150–400)
RBC: 5 MIL/uL (ref 4.22–5.81)
RDW: 14.1 % (ref 11.5–15.5)
WBC: 4.8 10*3/uL (ref 4.0–10.5)

## 2013-10-24 LAB — GLUCOSE, CAPILLARY: Glucose-Capillary: 82 mg/dL (ref 70–99)

## 2013-10-24 LAB — CG4 I-STAT (LACTIC ACID): Lactic Acid, Venous: 1.73 mmol/L (ref 0.5–2.2)

## 2013-10-24 LAB — ETHANOL: ALCOHOL ETHYL (B): 77 mg/dL — AB (ref 0–11)

## 2013-10-24 MED ORDER — SODIUM CHLORIDE 0.9 % IV SOLN
Freq: Once | INTRAVENOUS | Status: AC
  Administered 2013-10-24: 06:00:00 via INTRAVENOUS

## 2013-10-24 NOTE — ED Notes (Signed)
CBG on scene 85.

## 2013-10-24 NOTE — ED Notes (Signed)
DR.Lockwood shown results of Istat Lactic Acid. ED-Lab

## 2013-10-24 NOTE — ED Provider Notes (Signed)
CSN: [id number]     Arrival date & time 10/24/13  4098 History   First MD Initiated Contact with Patient 10/24/13 [phone]     Chief Complaint  Patient presents with  . Altered Mental Status   (Consider location/radiation/quality/duration/timing/severity/associated sxs/prior Treatment) HPI This patient is a 23 year old man with history of Crohn's disease. Brought in by EMS from his home. History is obtained from paramedics who obtained history from the patient's girlfriend. Girlfriend says that the patient was out drinking liquor and smoking marijuana until the morning hours. He came home and went upstairs. Then he came downstairs and she heard him fall. She called EMS.  Paramedics report that first responders found the patient unresponsive and that he did not respond to sternal rub. When paramedics arrived at the scene, the patient was alert and able to endorse having several alcoholic drinks and smoking marijuana earlier this morning. They report that he had a normal Accu-Chek in the field and normal vital signs.  The patient nods his head in an affirmative response when I asked if he smokes marijuana and uses alcohol earlier today. He shook his head in denial when I asked about use of any pills, cocaine, heroin  or any other illicit drugs. The patient has no diagnosed history of seizure disorder. I am unable to obtain any history from the patient at this time secondary to altered mental status.  Past Medical History  Diagnosis Date  . Hard of hearing   . Tics of organic origin   . Crohn's   . Brief psychotic disorder   . Weight loss   . Cerebral concussion    Past Surgical History  Procedure Laterality Date  . Knee surgery     Family History  Problem Relation Age of Onset  . Bipolar disorder Father   . Hypertension Mother   . Prostate cancer Paternal Grandfather   . Alcohol abuse Father   . Colon cancer Neg Hx    History  Substance Use Topics  . Smoking status: Current Every Day  Smoker -- 0.50 packs/day    Types: Cigarettes  . Smokeless tobacco: Never Used  . Alcohol Use: Yes    Review of Systems Unable to obtain from the patient secondary to altered mental status. Please note that level V  applies.  Allergies  Ibuprofen and Penicillins  Home Medications   Current Outpatient Rx  Name  Route  Sig  Dispense  Refill  . HYDROcodone-acetaminophen (NORCO/VICODIN) 5-325 MG per tablet   Oral   Take 1 tablet by mouth every 4 (four) hours as needed.   15 tablet   0    Physical Exam Gen: well developed and well nourished appearing Head: NCAT, no signs of trauma Eyes: PERL, EOMI Nose: no epistaixis or rhinorrhea Mouth/throat: mucosa is moist and pink, no signs of intraoral trauma Neck: Cervical spine is tender, supple, no stridor Lungs: CTA B, no wheezing, rhonchi or rales, respirations 20 per minute CV: RRR, pulse in the 70s, no murmur, extremities appear well perfused, good radial pulses bilaterally.  Abd: soft, notender, nondistended Back: no ttp, no cva ttp Skin: warm and dry Ext: normal to inspection, no dependent edema Neuro: pupils are approx 3mm equal and reactive to light, patient nods head in response to questions and whispers his name when asked what it is, patient moves all 4 extremities on command. Psyche; flat and withdrawn affect, cooperative.   ED Course  Procedures (including critical care time) Labs Review  EKG: nsr, no acute  ischemic changes, normal intervals, normal axis, normal qrs complex  MDM  This 23 yo M presents with AMS following reported use of marijuana in conjuction with large amount of alcohol. We will obtain ETOH level and UDS. We will tx with IVF. The patient has normal VS and no focal motor deficits. There seems to be an aspect of choice to his lack of verbal response as he follows commands to move his extremities.   0530: ADVISED BY NURSING STAFF THAT 5 BAGS OF MARIJUANA WERE FOUND IN THE PATIENT'S POCKET.   Brandt LoosenJulie  Deshayla Empson, MD 10/25/13 [phone]

## 2013-10-24 NOTE — Discharge Instructions (Signed)
Alcohol Intoxication °Alcohol intoxication occurs when the amount of alcohol that a person has consumed impairs his or her ability to mentally and physically function. Alcohol directly impairs the normal chemical activity of the brain. Drinking large amounts of alcohol can lead to changes in mental function and behavior, and it can cause many physical effects that can be harmful.  °Alcohol intoxication can range in severity from mild to very severe. Various factors can affect the level of intoxication that occurs, such as the person's age, gender, weight, frequency of alcohol consumption, and the presence of other medical conditions (such as diabetes, seizures, or heart conditions). Dangerous levels of alcohol intoxication may occur when people drink large amounts of alcohol in a short period (binge drinking). Alcohol can also be especially dangerous when combined with certain prescription medicines or "recreational" drugs. °SIGNS AND SYMPTOMS °Some common signs and symptoms of mild alcohol intoxication include: °· Loss of coordination. °· Changes in mood and behavior. °· Impaired judgment. °· Slurred speech. °As alcohol intoxication progresses to more severe levels, other signs and symptoms will appear. These may include: °· Vomiting. °· Confusion and impaired memory. °· Slowed breathing. °· Seizures. °· Loss of consciousness. °DIAGNOSIS  °Your health care provider will take a medical history and perform a physical exam. You will be asked about the amount and type of alcohol you have consumed. Blood tests will be done to measure the concentration of alcohol in your blood. In many places, your blood alcohol level must be lower than 80 mg/dL (0.08%) to legally drive. However, many dangerous effects of alcohol can occur at much lower levels.  °TREATMENT  °People with alcohol intoxication often do not require treatment. Most of the effects of alcohol intoxication are temporary, and they go away as the alcohol naturally  leaves the body. Your health care provider will monitor your condition until you are stable enough to go home. Fluids are sometimes given through an IV access tube to help prevent dehydration.  °HOME CARE INSTRUCTIONS °· Do not drive after drinking alcohol. °· Stay hydrated. Drink enough water and fluids to keep your urine clear or pale yellow. Avoid caffeine.   °· Only take over-the-counter or prescription medicines as directed by your health care provider.   °SEEK MEDICAL CARE IF:  °· You have persistent vomiting.   °· You do not feel better after a few days. °· You have frequent alcohol intoxication. Your health care provider can help determine if you should see a substance use treatment counselor. °SEEK IMMEDIATE MEDICAL CARE IF:  °· You become shaky or tremble when you try to stop drinking.   °· You shake uncontrollably (seizure).   °· You throw up (vomit) blood. This may be bright red or may look like black coffee grounds.   °· You have blood in your stool. This may be bright red or may appear as a black, tarry, bad smelling stool.   °· You become lightheaded or faint.   °MAKE SURE YOU:  °· Understand these instructions. °· Will watch your condition. °· Will get help right away if you are not doing well or get worse. °Document Released: 07/03/2005 Document Revised: 05/26/2013 Document Reviewed: 02/26/2013 °Exit[employer]® Patient Information ©2014 [employer]. ° °

## 2013-10-24 NOTE — ED Notes (Signed)
Patient's earrings removed by Lenard Simmerhelsea Kirby for CT.  They were placed in pink denture cup and returned with patient to ED room D35.  Patient is completely somnolent/unresponsive at this time. Ian MalkinZach, RN notified of same.

## 2013-10-24 NOTE — ED Provider Notes (Signed)
9:12 AM Patient minimally awake  11:37 AM Patient ambulating.  Gerhard Munchobert Phoua Hoadley, MD 10/24/13 1137

## 2013-10-24 NOTE — ED Notes (Signed)
CBG-82. Notified RN

## 2013-10-24 NOTE — ED Notes (Signed)
Per EMS: Pt from home after "partying." Pt ingested a substantial amount of ETOH and smoked Marijuana. Girlfriend found patient hyperventilating. Pt loss consciousness. Pt currently alert to hisself. Pt ax4, NAD.

## 2013-10-24 NOTE — ED Notes (Signed)
Pt knows that urine is needed. Pt was given a urinal and he said he would push his call button to let us know when he was done

## 2013-10-24 NOTE — ED Notes (Signed)
The 5 Bags containing "Green Leafy Substance"given to [employer] Corporationfficer Zimmer with [employer]. Sweat pants and "yellow necklace" given back to patient in belonging bag.

## 2013-10-24 NOTE — ED Notes (Signed)
Upon undressing patient multiple plastic bags noted in pocket containing green leafy substance. Pt unable to identify. [employer] notified. Pants containing bags placed in belonging bag and to be kept with RN per [employer] until field officer arrives.

## 2014-05-05 ENCOUNTER — Emergency Department (HOSPITAL_COMMUNITY): Payer: Self-pay

## 2014-05-05 ENCOUNTER — Emergency Department (HOSPITAL_COMMUNITY)
Admission: EM | Admit: 2014-05-05 | Discharge: 2014-05-05 | Disposition: A | Discharge status: Home / Self Care | Payer: Self-pay | Attending: Emergency Medicine | Admitting: Emergency Medicine

## 2014-05-05 ENCOUNTER — Encounter (HOSPITAL_COMMUNITY): Payer: Self-pay | Admitting: Emergency Medicine

## 2014-05-05 DIAGNOSIS — Y929 Unspecified place or not applicable: Secondary | ICD-10-CM | POA: Insufficient documentation

## 2014-05-05 DIAGNOSIS — S6990XA Unspecified injury of unspecified wrist, hand and finger(s), initial encounter: Secondary | ICD-10-CM | POA: Insufficient documentation

## 2014-05-05 DIAGNOSIS — M79641 Pain in right hand: Secondary | ICD-10-CM

## 2014-05-05 DIAGNOSIS — Z8719 Personal history of other diseases of the digestive system: Secondary | ICD-10-CM | POA: Insufficient documentation

## 2014-05-05 DIAGNOSIS — IMO0002 Reserved for concepts with insufficient information to code with codable children: Secondary | ICD-10-CM | POA: Insufficient documentation

## 2014-05-05 DIAGNOSIS — Z8659 Personal history of other mental and behavioral disorders: Secondary | ICD-10-CM | POA: Insufficient documentation

## 2014-05-05 DIAGNOSIS — Y9389 Activity, other specified: Secondary | ICD-10-CM | POA: Insufficient documentation

## 2014-05-05 DIAGNOSIS — Z87891 Personal history of nicotine dependence: Secondary | ICD-10-CM | POA: Insufficient documentation

## 2014-05-05 DIAGNOSIS — S60229A Contusion of unspecified hand, initial encounter: Secondary | ICD-10-CM | POA: Insufficient documentation

## 2014-05-05 DIAGNOSIS — Z8669 Personal history of other diseases of the nervous system and sense organs: Secondary | ICD-10-CM | POA: Insufficient documentation

## 2014-05-05 MED ORDER — HYDROCODONE-ACETAMINOPHEN 5-325 MG PO TABS: 1.0000 | ORAL_TABLET | ORAL | Status: DC | PRN

## 2014-05-05 NOTE — ED Provider Notes (Signed)
CSN: [id number]     Arrival date & time 05/05/14  1031 History  This chart was scribed for non-physician practitioner, Trixie Dredge, PA-C, working with Shon Baton, MD by Charline Bills, ED Scribe. This patient was seen in room TR08C/TR08C and the patient's care was started at 10:54 AM.   Chief Complaint  Patient presents with  . Hand Injury   The history is provided by the patient. No language interpreter was used.   HPI Comments: [name] is a 23 y.o. male who presents to the Emergency Department complaining of constant R hand pain onset last night. Pt states that his brother upset him and he punched concrete to avoid hitting his brother. He currently rates his pain 9.5/10. He reports associated swelling and numbness to his knuckles. Pt states that he is not able to make a fist due to the severity of pain. He denies wrist pain, a break in skin or hitting a person. Pt has not taken any medication for pain.  Past Medical History  Diagnosis Date  . Hard of hearing   . Tics of organic origin   . Crohn's   . Brief psychotic disorder   . Weight loss   . Cerebral concussion    Past Surgical History  Procedure Laterality Date  . Knee surgery     Family History  Problem Relation Age of Onset  . Bipolar disorder Father   . Hypertension Mother   . Prostate cancer Paternal Grandfather   . Alcohol abuse Father   . Colon cancer Neg Hx    History  Substance Use Topics  . Smoking status: Former Games developer  . Smokeless tobacco: Never Used  . Alcohol Use: Yes    Review of Systems  Musculoskeletal: Positive for myalgias.  Neurological: Positive for numbness.  All other systems reviewed and are negative.  Allergies  Ibuprofen  Home Medications   Prior to Admission medications   Not on File   Triage Vitals: BP 124/72  Pulse 90  Temp(Src) 98.1 F (36.7 C) (Oral)  SpO2 98% Physical Exam  Nursing note and vitals reviewed. Constitutional: He appears well-developed and  well-nourished. No distress.  HENT:  Head: Normocephalic and atraumatic.  Neck: Neck supple.  Pulmonary/Chest: Effort normal.  Musculoskeletal:  Diffuse edema and tenderness throughout the mid hand over the 2nd-5th metacarpals   No wrist tendernss No snuff box tenderness Ditsal sensation intact Capillary refill less than 2 seconds ROM in fingers are limited, secondary to pain  Neurological: He is alert.  Skin: He is not diaphoretic.   ED Course  Procedures (including critical care time) DIAGNOSTIC STUDIES: Oxygen Saturation is 98% on RA, normal by my interpretation.    COORDINATION OF CARE: 10:57 AM-Discussed treatment plan which includes XR with pt at bedside and pt agreed to plan.   Labs Review Labs Reviewed - No data to display  Imaging Review Dg Hand Complete Right  05/05/2014   CLINICAL DATA:  Right hand pain status post trauma. The pain is centered in the fourth and fifth metacarpophalangeal joint regions.  EXAM: RIGHT HAND - COMPLETE 3+ VIEW  COMPARISON:  None.  FINDINGS: The bones of the right hand are adequately mineralized. There is no acute fracture nor dislocation. Specific attention to the metacarpophalangeal joint regions reveals no acute bony abnormality. There is mild soft tissue swelling over the lateral metacarpal heads.  IMPRESSION: There is no acute bony abnormality of the right hand.   Electronically Signed   By: David  [place]  On: 05/05/2014 11:24    EKG Interpretation None      MDM   Final diagnoses:  Right hand pain   Afebrile, nontoxic patient with right hand contusion.  Xrays negative.  Neurovascularly intact.  Recommended RICE treatment, d/c home with #6 vicodin, ACE wrap.  Discussed result, findings, treatment, and follow up  with patient.  Pt given return precautions.  Pt verbalizes understanding and agrees with plan.      I personally performed the services described in this documentation, which was scribed in my presence. The recorded  information has been reviewed and is accurate.    Trixie Dredgemily Janiylah Hannis, PA-C 05/05/14 1138

## 2014-05-05 NOTE — ED Notes (Signed)
Patient states he hit some concrete and has had hand pain/swelling since.   Patient states "I think it's broke".

## 2014-05-05 NOTE — Discharge Instructions (Signed)
Read the information below.  Use the prescribed medication as directed.  Please discuss all new medications with your pharmacist.  Do not take additional tylenol while taking the prescribed pain medication to avoid overdose.  You may return to the Emergency Department at any time for worsening condition or any new symptoms that concern you.    If you develop uncontrolled pain, weakness or numbness of the extremity, severe discoloration of the skin, or you are unable to move your fingers, return to the ER for a recheck.    °

## 2014-05-13 NOTE — ED Provider Notes (Signed)
Medical screening examination/treatment/procedure(s) were performed by non-physician practitioner and as supervising physician I was immediately available for consultation/collaboration.   EKG Interpretation None        Eon Zunker F Zaniel Marineau, MD 05/13/14 2256 

## 2014-05-24 ENCOUNTER — Emergency Department (HOSPITAL_COMMUNITY)
Admission: EM | Admit: 2014-05-24 | Discharge: 2014-05-24 | Discharge status: Absent without leave | Payer: Self-pay | Source: Home / Self Care

## 2014-05-25 ENCOUNTER — Other Ambulatory Visit (HOSPITAL_COMMUNITY)
Admission: RE | Admit: 2014-05-25 | Discharge: 2014-05-25 | Disposition: A | Discharge status: Home / Self Care | Payer: Self-pay | Source: Ambulatory Visit | Attending: Emergency Medicine | Admitting: Emergency Medicine

## 2014-05-25 ENCOUNTER — Encounter (HOSPITAL_COMMUNITY): Payer: Self-pay | Admitting: Emergency Medicine

## 2014-05-25 ENCOUNTER — Emergency Department (INDEPENDENT_AMBULATORY_CARE_PROVIDER_SITE_OTHER)
Admission: EM | Admit: 2014-05-25 | Discharge: 2014-05-25 | Disposition: A | Discharge status: Home / Self Care | Payer: Self-pay | Source: Home / Self Care | Attending: Emergency Medicine | Admitting: Emergency Medicine

## 2014-05-25 DIAGNOSIS — Z113 Encounter for screening for infections with a predominantly sexual mode of transmission: Secondary | ICD-10-CM | POA: Insufficient documentation

## 2014-05-25 DIAGNOSIS — Z202 Contact with and (suspected) exposure to infections with a predominantly sexual mode of transmission: Secondary | ICD-10-CM

## 2014-05-25 MED ORDER — METRONIDAZOLE 500 MG PO TABS: 1000.0000 mg | ORAL_TABLET | Freq: Two times a day (BID) | ORAL | Status: DC

## 2014-05-25 NOTE — Discharge Instructions (Signed)

## 2014-05-25 NOTE — ED Provider Notes (Signed)
Medical screening examination/treatment/procedure(s) were performed by non-physician practitioner and as supervising physician I was immediately available for consultation/collaboration.  Rickey Farrier, M.D.  Shaniquia Brafford C Tawna Alwin, MD 05/25/14 1032 

## 2014-05-25 NOTE — ED Notes (Signed)
Here for exposure to std States he received a call from ex partner that she had trichomas  States he does have lower abd pain for two days

## 2014-05-25 NOTE — ED Provider Notes (Signed)
CSN: [id number]     Arrival date & time 05/25/14  0820 History   First MD Initiated Contact with Patient 05/25/14 [phone]     Chief Complaint  Patient presents with  . Exposure to STD   (Consider location/radiation/quality/duration/timing/severity/associated sxs/prior Treatment) HPI Comments: 23 year old male presents for evaluation after exposure to Trichomonas. He was called by his ex-girlfriend and told that she tested positive for trichomonas. He denies any symptoms, including the testicle pain or penile discharge, and no genital lesions.   Past Medical History  Diagnosis Date  . Hard of hearing   . Tics of organic origin   . Crohn's   . Brief psychotic disorder   . Weight loss   . Cerebral concussion    Past Surgical History  Procedure Laterality Date  . Knee surgery     Family History  Problem Relation Age of Onset  . Bipolar disorder Father   . Hypertension Mother   . Prostate cancer Paternal Grandfather   . Alcohol abuse Father   . Colon cancer Neg Hx    History  Substance Use Topics  . Smoking status: Former Games developermoker  . Smokeless tobacco: Never Used  . Alcohol Use: Yes    Review of Systems  Constitutional: Negative for fever, chills and fatigue.  HENT: Negative for sore throat.   Eyes: Negative for visual disturbance.  Respiratory: Negative for cough and shortness of breath.   Cardiovascular: Negative for chest pain, palpitations and leg swelling.  Gastrointestinal: Negative for nausea, vomiting, abdominal pain, diarrhea and constipation.  Genitourinary: Negative for dysuria, urgency, frequency, hematuria, discharge and testicular pain.  Musculoskeletal: Negative for arthralgias, myalgias, neck pain and neck stiffness.  Skin: Negative for rash.  Neurological: Negative for dizziness, weakness and light-headedness.  All other systems reviewed and are negative.   Allergies  Ibuprofen  Home Medications   Prior to Admission medications   Medication Sig Start  Date End Date Taking? Authorizing Provider  HYDROcodone-acetaminophen (NORCO/VICODIN) 5-325 MG per tablet Take 1 tablet by mouth every 4 (four) hours as needed for moderate pain or severe pain. 05/05/14   Trixie DredgeEmily West, PA-C  metroNIDAZOLE (FLAGYL) 500 MG tablet Take 2 tablets (1,000 mg total) by mouth every 12 (twelve) hours. 05/25/14   Adrian BlackwaterZachary H Asalee Barrette, PA-C   BP 117/63  Pulse 63  Temp(Src) 98.3 F (36.8 C) (Oral)  Resp 14  SpO2 100% Physical Exam  Nursing note and vitals reviewed. Constitutional: He is oriented to person, place, and time. He appears well-developed and well-nourished. No distress.  HENT:  Head: Normocephalic.  Pulmonary/Chest: Effort normal. No respiratory distress.  Neurological: He is alert and oriented to person, place, and time. Coordination normal.  Skin: Skin is warm and dry. No rash noted. He is not diaphoretic.  Psychiatric: He has a normal mood and affect. Judgment normal.    ED Course  Procedures (including critical care time) Labs Review Labs Reviewed  URINE CYTOLOGY ANCILLARY ONLY    Imaging Review No results found.   MDM   1. Trichomonas exposure    Urine cytology sent, treat for trichomonas with metronidazole. He will go to the health department for  HIV screen.   Meds ordered this encounter  Medications  . metroNIDAZOLE (FLAGYL) 500 MG tablet    Sig: Take 2 tablets (1,000 mg total) by mouth every 12 (twelve) hours.    Dispense:  4 tablet    Refill:  0    Order Specific Question:  Supervising Provider    Answer:  KELLER, DAVID C [[id number]]       Graylon Good, PA-C 05/25/14 1004

## 2014-06-21 ENCOUNTER — Ambulatory Visit (INDEPENDENT_AMBULATORY_CARE_PROVIDER_SITE_OTHER): Payer: Self-pay

## 2014-06-21 VITALS — BP 113/74 | HR 76 | Resp 14 | Ht 72.0 in | Wt 155.0 lb

## 2014-06-21 DIAGNOSIS — Q828 Other specified congenital malformations of skin: Secondary | ICD-10-CM

## 2014-06-21 DIAGNOSIS — M79609 Pain in unspecified limb: Secondary | ICD-10-CM

## 2014-06-21 DIAGNOSIS — M2040 Other hammer toe(s) (acquired), unspecified foot: Secondary | ICD-10-CM

## 2014-06-21 DIAGNOSIS — M79673 Pain in unspecified foot: Secondary | ICD-10-CM

## 2014-06-21 DIAGNOSIS — M2180 Other specified acquired deformities of unspecified limb: Secondary | ICD-10-CM

## 2014-06-21 DIAGNOSIS — M2010 Hallux valgus (acquired), unspecified foot: Secondary | ICD-10-CM

## 2014-06-21 NOTE — Patient Instructions (Signed)
Corns and Calluses Corns are small areas of thickened skin that usually occur on the top, sides, or tip of a toe. They contain a cone-shaped core with a point that can press on a nerve below. This causes pain. Calluses are areas of thickened skin that usually develop on hands, fingers, palms, soles of the feet, and heels. These are areas that experience frequent friction or pressure. CAUSES  Corns are usually the result of rubbing (friction) or pressure from shoes that are too tight or do not fit properly. Calluses are caused by repeated friction and pressure on the affected areas. SYMPTOMS  A hard growth on the skin.  Pain or tenderness under the skin.  Sometimes, redness and swelling.  Increased discomfort while wearing tight-fitting shoes. DIAGNOSIS  Your caregiver can usually tell what the problem is by doing a physical exam. TREATMENT  Removing the cause of the friction or pressure is usually the only treatment needed. However, sometimes medicines can be used to help soften the hardened, thickened areas. These medicines include salicylic acid plasters and 12% ammonium lactate lotion. These medicines should only be used under the direction of your caregiver. HOME CARE INSTRUCTIONS   Try to remove pressure from the affected area.  You may wear donut-shaped corn pads to protect your skin.  You may use a pumice stone or nonmetallic nail file to gently reduce the thickness of a corn.  Wear properly fitted footwear.  If you have calluses on the hands, wear gloves during activities that cause friction.  If you have diabetes, you should regularly examine your feet. Tell your caregiver if you notice any problems with your feet. SEEK IMMEDIATE MEDICAL CARE IF:   You have increased pain, swelling, redness, or warmth in the affected area.  Your corn or callus starts to drain fluid or bleeds.  You are not getting better, even with treatment. Document Released: 06/29/2004 Document  Revised: 12/16/2011 Document Reviewed: 05/21/2011 Exit[employer] Patient Information 2015 [employer]. This information is not intended to replace advice given to you by your health care provider. Make sure you discuss any questions you have with your health care provider.  

## 2014-06-21 NOTE — Progress Notes (Signed)
   Subjective:    Patient ID: [name], male    DOB: 10/16/1990, 23 y.o.   MRN: [id number]  HPI Comments: Pt complains of plantar foot and B/L 2 -5 toe pain for years.  Pt states the areas hurt worse when weightbearing.  B/L 4th MPJ, and B/L 1st medial toes have areas of hard skin, and B/L 2-5 toes contract down, B/L 1st DPJ are contracted down and B/L 2nd toes have hard corns medially.      Review of Systems  All other systems reviewed and are negative.      Objective:   Physical Exam 23 year old African American male percent at this time for initial visit well-developed well-nourished oriented x3. Patient's complaint of pain bilateral forefoot as well as medial hallux area bilateral. Does work in [employer] stand on his feet daily for many hours where is for the most part athletic shoes currently wearing a pair of sandals. Lower extremity objective findings as follows vascular status is intact with pedal pulses palpable DP and PT +2/4 bilateral capillary refill time 3 seconds all digits epicritic and proprioceptive sensations intact and symmetric bilateral there is normal plantar response and DTRs. Dermatologically skin color pigment normal hair growth present but diminished distally there is keratoses sub-fourth MTP area bilateral medial second digits bilateral left more so than right there is pinch callus of the hallux IP joint bilateral. Biomechanical and orthopedic exam should note that is rectus mild promontory changes on weightbearing noted clinically and radiographically. There is a hallux abductus interphalangeus with pinch callus in the hallux noted the MTP appears to be relatively rectus and unremarkable bilateral there is hammering of toes 234 and 5 with adductovarus rotation 4 and 5 there is medial deviation second digits bilateral with keratoses distal IP joints second bilateral due to the interphalangeus and hammertoe interface. X-rays confirm elevated I am angle 1112  sesamoid position 3-4 bilateral hammertoes 234 and 5 with semirigid contractures at the IP joints. Dorsal displacement at the MTP joints. There is mild inferior calcaneal spurs noted bilaterally and my again note mild hallux interphalangeus noted bilateral.       Assessment & Plan:  Assessment this time is painful corns and calluses support keratoses secondary to digital contractures hammertoe deformity and hallux interphalangeus deformity. There is also plantar grade metatarsal due to the contracture the toes in particular fourth bilateral no severe and significant with cautery changes of the foot patient is collapsing and contracting at the digits. At this time multiple keratoses are debrided the recommendation for a pumice stone or a callus file he continues weekly recommend a lotion to the feet such as cocoa butter. Also recommended wider accommodative shoes avoid entire constrictive and a thick pedestal and also recommended OTC orthotics which is superficial which can be obtained [employer] or [employer].  Maintain shoes and arch supports literature and hammertoes also dispensed a last alternative would be possible hallux interphalangeus repaired hammertoe repair. Patient is advised him to be out of work for 6-8 weeks for each foot patient is rhinitis and current insurance however this point recommended conservative care to initiate with padding cushioning and orthotics and debridement in the future as needed recheck in 2 months if symptoms persist following debridement today patient noticed a significant relief instantly advised to maintain that with using a pumice stone or callus file followup in 2 months  Alvan Dame DPM

## 2014-08-23 ENCOUNTER — Ambulatory Visit: Payer: Self-pay

## 2015-04-09 ENCOUNTER — Encounter (HOSPITAL_COMMUNITY): Payer: Self-pay | Admitting: Emergency Medicine

## 2015-04-09 ENCOUNTER — Emergency Department (HOSPITAL_COMMUNITY)
Admission: EM | Admit: 2015-04-09 | Discharge: 2015-04-09 | Disposition: A | Discharge status: Home / Self Care | Payer: Self-pay | Attending: Emergency Medicine | Admitting: Emergency Medicine

## 2015-04-09 ENCOUNTER — Emergency Department (HOSPITAL_COMMUNITY): Payer: Self-pay

## 2015-04-09 DIAGNOSIS — Z23 Encounter for immunization: Secondary | ICD-10-CM | POA: Insufficient documentation

## 2015-04-09 DIAGNOSIS — S0990XA Unspecified injury of head, initial encounter: Secondary | ICD-10-CM | POA: Insufficient documentation

## 2015-04-09 DIAGNOSIS — R000 Tachycardia, unspecified: Secondary | ICD-10-CM | POA: Insufficient documentation

## 2015-04-09 DIAGNOSIS — Y9389 Activity, other specified: Secondary | ICD-10-CM | POA: Insufficient documentation

## 2015-04-09 DIAGNOSIS — S61411A Laceration without foreign body of right hand, initial encounter: Secondary | ICD-10-CM | POA: Insufficient documentation

## 2015-04-09 DIAGNOSIS — Z8719 Personal history of other diseases of the digestive system: Secondary | ICD-10-CM | POA: Insufficient documentation

## 2015-04-09 DIAGNOSIS — R4182 Altered mental status, unspecified: Secondary | ICD-10-CM | POA: Insufficient documentation

## 2015-04-09 DIAGNOSIS — S61011A Laceration without foreign body of right thumb without damage to nail, initial encounter: Secondary | ICD-10-CM | POA: Insufficient documentation

## 2015-04-09 DIAGNOSIS — S199XXA Unspecified injury of neck, initial encounter: Secondary | ICD-10-CM | POA: Insufficient documentation

## 2015-04-09 DIAGNOSIS — Z8659 Personal history of other mental and behavioral disorders: Secondary | ICD-10-CM | POA: Insufficient documentation

## 2015-04-09 DIAGNOSIS — Z87828 Personal history of other (healed) physical injury and trauma: Secondary | ICD-10-CM | POA: Insufficient documentation

## 2015-04-09 DIAGNOSIS — Y9289 Other specified places as the place of occurrence of the external cause: Secondary | ICD-10-CM | POA: Insufficient documentation

## 2015-04-09 DIAGNOSIS — Y289XXA Contact with unspecified sharp object, undetermined intent, initial encounter: Secondary | ICD-10-CM | POA: Insufficient documentation

## 2015-04-09 DIAGNOSIS — Z8669 Personal history of other diseases of the nervous system and sense organs: Secondary | ICD-10-CM | POA: Insufficient documentation

## 2015-04-09 DIAGNOSIS — Y999 Unspecified external cause status: Secondary | ICD-10-CM | POA: Insufficient documentation

## 2015-04-09 DIAGNOSIS — S61412A Laceration without foreign body of left hand, initial encounter: Secondary | ICD-10-CM | POA: Insufficient documentation

## 2015-04-09 DIAGNOSIS — Z87891 Personal history of nicotine dependence: Secondary | ICD-10-CM | POA: Insufficient documentation

## 2015-04-09 LAB — COMPREHENSIVE METABOLIC PANEL
ALBUMIN: 3.6 g/dL (ref 3.5–5.0)
ALT: 21 U/L (ref 17–63)
ANION GAP: 16 — AB (ref 5–15)
AST: 29 U/L (ref 15–41)
Alkaline Phosphatase: 72 U/L (ref 38–126)
BILIRUBIN TOTAL: 0.3 mg/dL (ref 0.3–1.2)
BUN: 12 mg/dL (ref 6–20)
CO2: 20 mmol/L — AB (ref 22–32)
Calcium: 9 mg/dL (ref 8.9–10.3)
Chloride: 104 mmol/L (ref 101–111)
Creatinine, Ser: 1.03 mg/dL (ref 0.61–1.24)
Glucose, Bld: 103 mg/dL — ABNORMAL HIGH (ref 65–99)
Potassium: 3.5 mmol/L (ref 3.5–5.1)
SODIUM: 140 mmol/L (ref 135–145)
Total Protein: 6.6 g/dL (ref 6.5–8.1)

## 2015-04-09 LAB — RAPID HIV SCREEN (HIV 1/2 AB+AG)
HIV 1/2 ANTIBODIES: NONREACTIVE
HIV-1 P24 Antigen - HIV24: NONREACTIVE

## 2015-04-09 LAB — URINE MICROSCOPIC-ADD ON

## 2015-04-09 LAB — URINALYSIS, ROUTINE W REFLEX MICROSCOPIC
BILIRUBIN URINE: NEGATIVE
Glucose, UA: NEGATIVE mg/dL
HGB URINE DIPSTICK: NEGATIVE
KETONES UR: NEGATIVE mg/dL
LEUKOCYTES UA: NEGATIVE
NITRITE: NEGATIVE
PH: 5.5 (ref 5.0–8.0)
PROTEIN: 30 mg/dL — AB
Specific Gravity, Urine: 1.009 (ref 1.005–1.030)
Urobilinogen, UA: 0.2 mg/dL (ref 0.0–1.0)

## 2015-04-09 LAB — CBC
HEMATOCRIT: 42.1 % (ref 39.0–52.0)
HEMOGLOBIN: 14.4 g/dL (ref 13.0–17.0)
MCH: 28.6 pg (ref 26.0–34.0)
MCHC: 34.2 g/dL (ref 30.0–36.0)
MCV: 83.5 fL (ref 78.0–100.0)
PLATELETS: 208 10*3/uL (ref 150–400)
RBC: 5.04 MIL/uL (ref 4.22–5.81)
RDW: 13.6 % (ref 11.5–15.5)
WBC: 7.4 10*3/uL (ref 4.0–10.5)

## 2015-04-09 LAB — RAPID URINE DRUG SCREEN, HOSP PERFORMED
Amphetamines: NOT DETECTED
BENZODIAZEPINES: NOT DETECTED
Barbiturates: NOT DETECTED
Cocaine: NOT DETECTED
OPIATES: NOT DETECTED
Tetrahydrocannabinol: NOT DETECTED

## 2015-04-09 LAB — CBG MONITORING, ED: Glucose-Capillary: 92 mg/dL (ref 65–99)

## 2015-04-09 MED ORDER — SODIUM CHLORIDE 0.9 % IV BOLUS (SEPSIS)
1000.0000 mL | INTRAVENOUS | Status: AC
  Administered 2015-04-09: 1000 mL via INTRAVENOUS

## 2015-04-09 MED ORDER — FENTANYL CITRATE (PF) 100 MCG/2ML IJ SOLN
50.0000 ug | Freq: Once | INTRAMUSCULAR | Status: AC
  Administered 2015-04-09: 50 ug via INTRAVENOUS
  Filled 2015-04-09: qty 2

## 2015-04-09 MED ORDER — LIDOCAINE HCL (PF) 1 % IJ SOLN
30.0000 mL | Freq: Once | INTRAMUSCULAR | Status: AC
  Administered 2015-04-09: 30 mL via INTRADERMAL
  Filled 2015-04-09: qty 30

## 2015-04-09 MED ORDER — TETANUS-DIPHTH-ACELL PERTUSSIS 5-2.5-18.5 LF-MCG/0.5 IM SUSP
0.5000 mL | Freq: Once | INTRAMUSCULAR | Status: AC
  Administered 2015-04-09: 0.5 mL via INTRAMUSCULAR
  Filled 2015-04-09: qty 0.5

## 2015-04-09 MED ORDER — ACETAMINOPHEN 325 MG PO TABS
650.0000 mg | ORAL_TABLET | Freq: Once | ORAL | Status: AC
  Administered 2015-04-09: 650 mg via ORAL
  Filled 2015-04-09: qty 2

## 2015-04-09 NOTE — ED Notes (Signed)
To CT
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
## 2015-04-09 NOTE — ED Notes (Signed)
Patient returned from CT

## 2015-04-09 NOTE — Discharge Instructions (Signed)
The patient was placed in a thumb spica to protect the laceration on his right thumb. He has no bony injury. He may take the splint off as needed.

## 2015-04-11 LAB — HEPATITIS C ANTIBODY (REFLEX): HCV Ab: <0.1 s/co ratio (ref 0.0–0.9)

## 2015-04-11 LAB — HEPATITIS B SURFACE ANTIGEN: HEP B S AG: NEGATIVE

## 2015-04-11 LAB — HCV COMMENT:

## 2016-06-03 IMAGING — CT CT HEAD W/O CM
4 of 6 series · 20 of 47 positions shown, 22 images · non-contrast
Comparison: Prior study from 10/24/2013

CLINICAL DATA: Initial evaluation for altered mental status,
possible fall with diffuse cervical pain.

EXAM:
CT HEAD WITHOUT CONTRAST
CT CERVICAL SPINE WITHOUT CONTRAST
TECHNIQUE: Multidetector CT imaging of the head and cervical spine was
performed following the standard protocol without intravenous
contrast. Multiplanar CT image reconstructions of the cervical spine
were also generated.

[Series 302: soft tissue, idose (2) · axial · 0.33mm/px · z∈[+66,+222]mm · 8 of 102 slices shown, 10 images]
[im 12/102  brain]
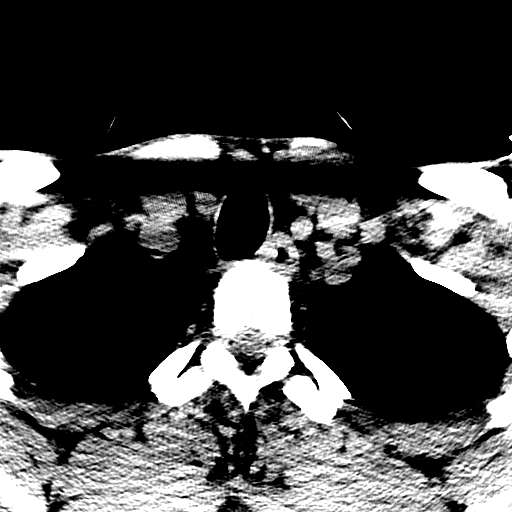
[im 12/102  bone]
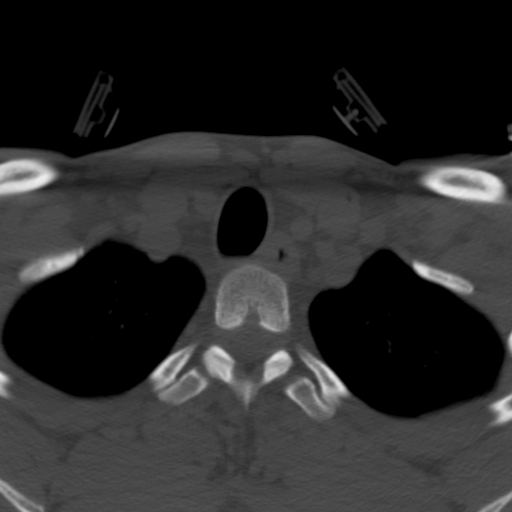
[im 23/102  brain]
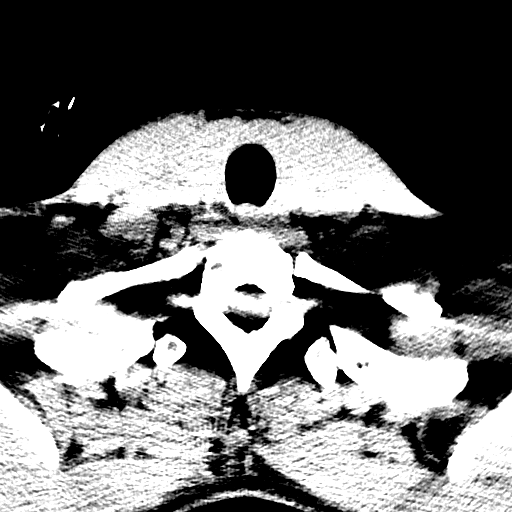
[im 34/102  brain]
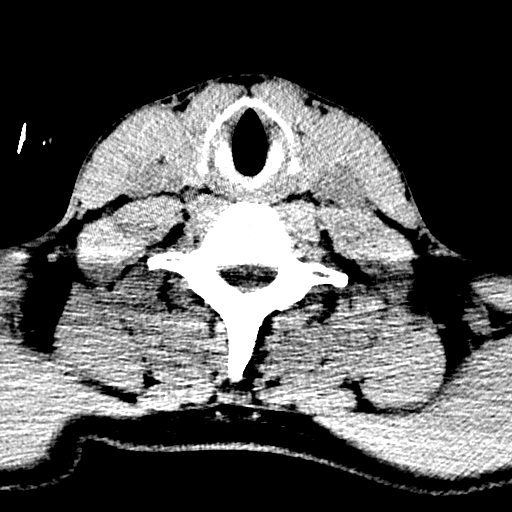
[im 45/102  brain]
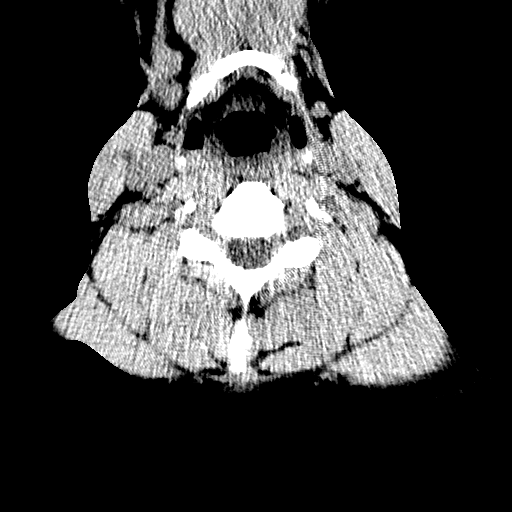
[im 57/102  brain]
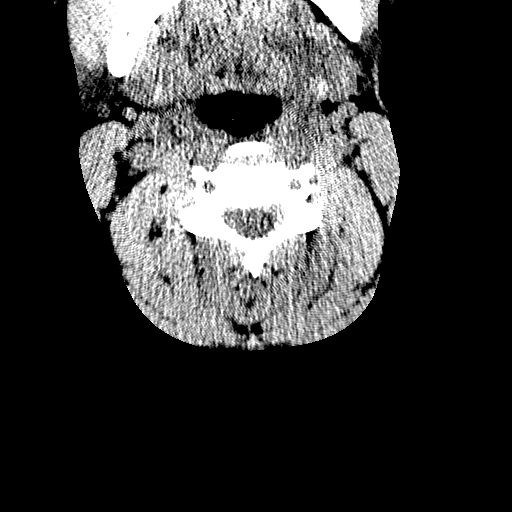
[im 57/102  bone]
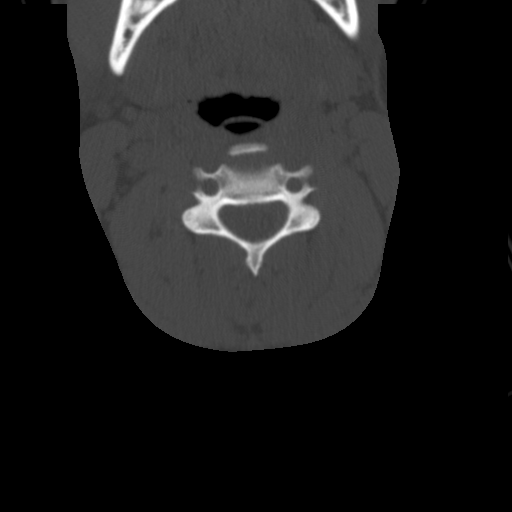
[im 68/102  brain]
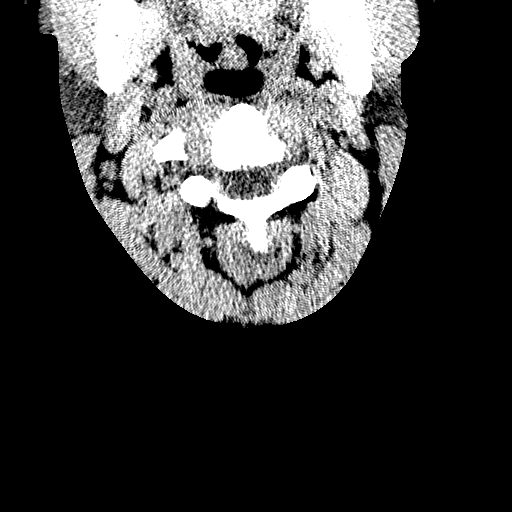
[im 79/102  brain]
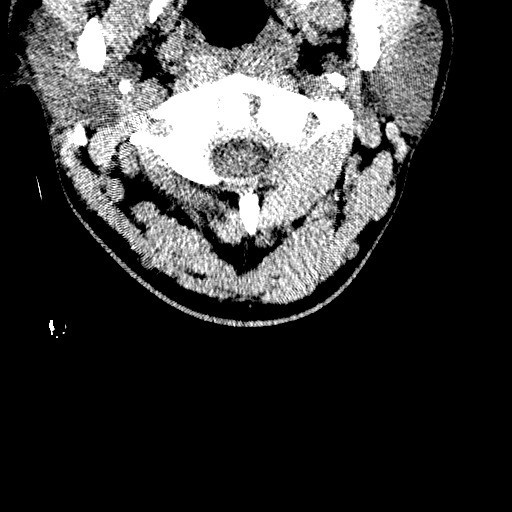
[im 90/102  brain]
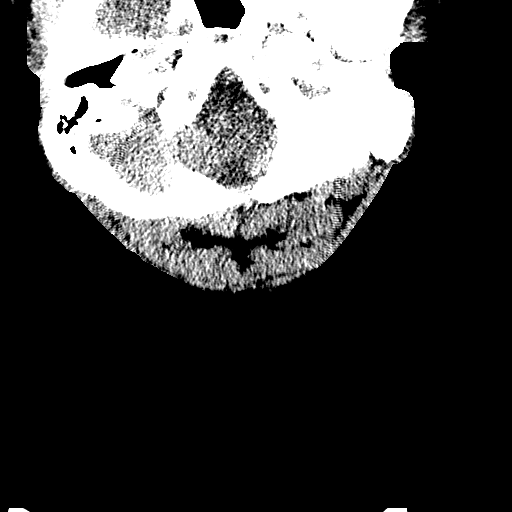

[Series 304: coronal, idose (2) · coronal · 0.36mm/px · 3 of 51 slices shown]
[im 17/51  brain]
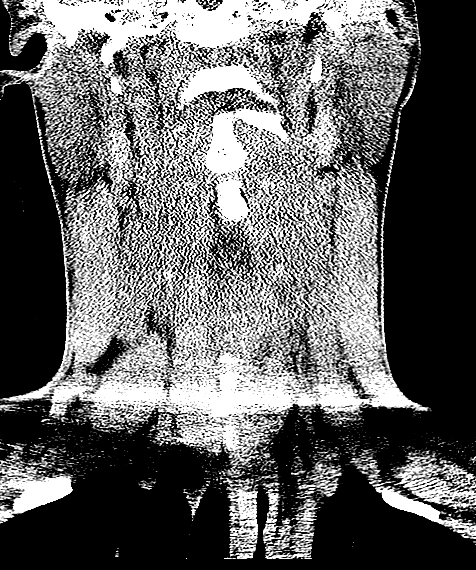
[im 23/51  brain]
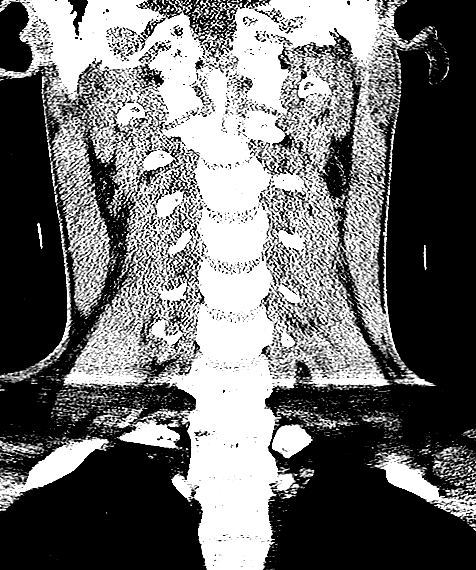
[im 28/51  brain]
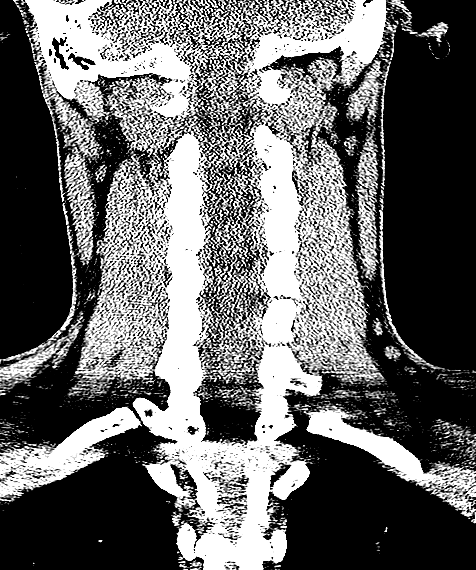

[Series 305: sagittal, idose (2) · sagittal · 0.33mm/px · 3 of 50 slices shown]
[im 17/50  brain]
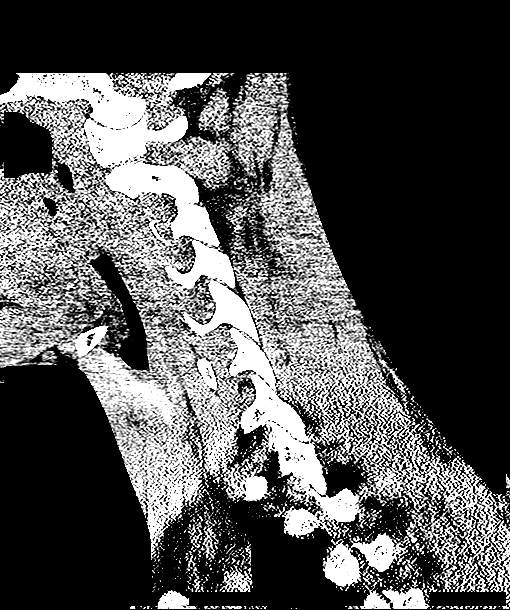
[im 25/50  brain]
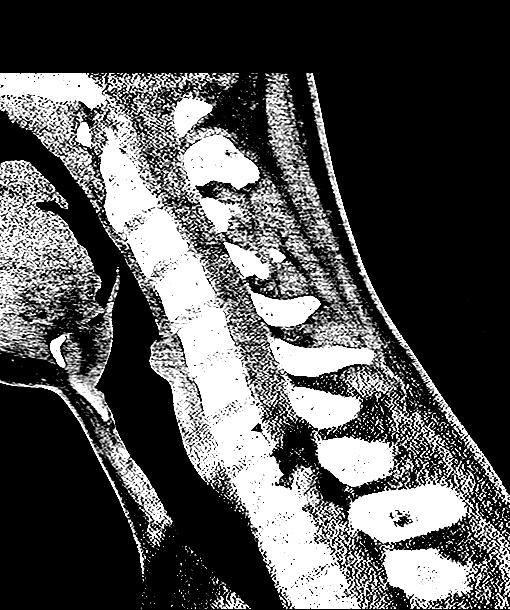
[im 33/50  brain]
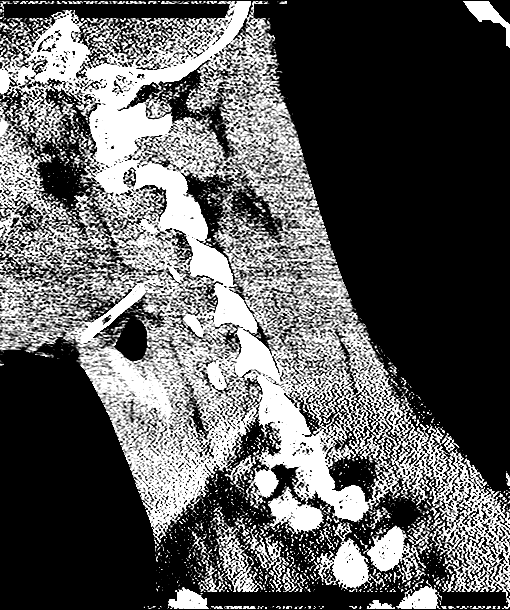

[Series 306: orthogonals, idose (2) · axial · 0.39mm/px · z∈[+46,+154]mm · 6 of 105 slices shown]
[im 12/105  brain]
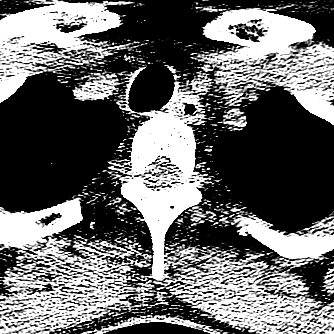
[im 24/105  brain]
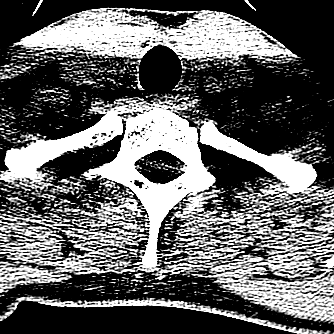
[im 35/105  brain]
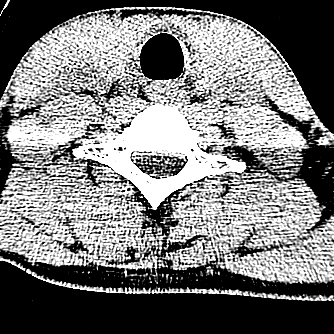
[im 47/105  brain]
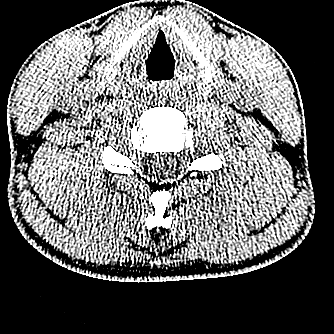
[im 58/105  brain]
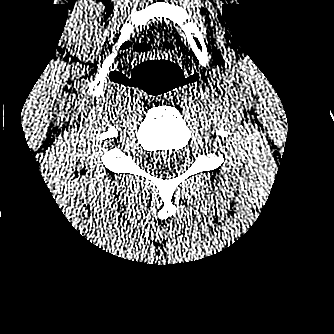
[im 70/105  brain]
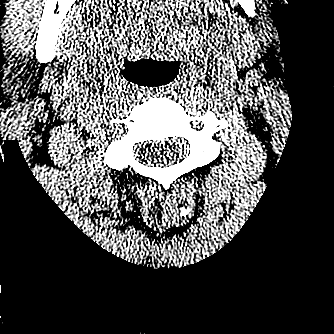

[20 of 47 positions shown; findings below may reference images not displayed]

FINDINGS: CT HEAD FINDINGS

There is no acute intracranial hemorrhage or infarct. No mass lesion
or midline shift. Gray-white matter differentiation is well
maintained. Ventricles are normal in size without evidence of
hydrocephalus. CSF containing spaces are within normal limits. No
extra-axial fluid collection.

The calvarium is intact.

Orbital soft tissues are within normal limits.

Scattered opacity present within the ethmoidal air cells. No
air-fluid levels to suggest active sinus infection. Mastoid air
cells are clear.

Scalp soft tissues are unremarkable.

CT CERVICAL SPINE FINDINGS

Slight reversal of the normal cervical lordosis with apex at C4.
Vertebral body heights are preserved. Normal C1-2 articulations are
intact. No prevertebral soft tissue swelling. No acute fracture or
listhesis.

Visualized soft tissues of the neck are within normal limits.
Visualized lung apices are clear without evidence of apical
pneumothorax.
IMPRESSION: CT BRAIN:

No acute intracranial process.

CT CERVICAL SPINE:

1. No acute traumatic injury within the cervical spine.
2. Slight reversal of the normal cervical lordosis, which may be
related to positioning and/or muscular spasm.

## 2017-06-24 ENCOUNTER — Emergency Department (HOSPITAL_COMMUNITY)
Admission: EM | Admit: 2017-06-24 | Discharge: 2017-06-24 | Disposition: A | Discharge status: Home / Self Care | Payer: Self-pay | Attending: Emergency Medicine | Admitting: Emergency Medicine

## 2017-06-24 ENCOUNTER — Emergency Department (HOSPITAL_COMMUNITY)
Admission: EM | Admit: 2017-06-24 | Discharge: 2017-06-24 | Discharge status: Against Medical Advice | Payer: Self-pay | Attending: Emergency Medicine | Admitting: Emergency Medicine

## 2017-06-24 ENCOUNTER — Emergency Department (HOSPITAL_COMMUNITY): Payer: Self-pay

## 2017-06-24 ENCOUNTER — Encounter (HOSPITAL_COMMUNITY): Payer: Self-pay | Admitting: Emergency Medicine

## 2017-06-24 ENCOUNTER — Encounter (HOSPITAL_COMMUNITY): Payer: Self-pay

## 2017-06-24 DIAGNOSIS — Y939 Activity, unspecified: Secondary | ICD-10-CM | POA: Insufficient documentation

## 2017-06-24 DIAGNOSIS — Y249XXA Unspecified firearm discharge, undetermined intent, initial encounter: Secondary | ICD-10-CM | POA: Insufficient documentation

## 2017-06-24 DIAGNOSIS — Y929 Unspecified place or not applicable: Secondary | ICD-10-CM | POA: Insufficient documentation

## 2017-06-24 DIAGNOSIS — Z87891 Personal history of nicotine dependence: Secondary | ICD-10-CM | POA: Insufficient documentation

## 2017-06-24 DIAGNOSIS — S6981XA Other specified injuries of right wrist, hand and finger(s), initial encounter: Secondary | ICD-10-CM | POA: Insufficient documentation

## 2017-06-24 DIAGNOSIS — S6991XA Unspecified injury of right wrist, hand and finger(s), initial encounter: Secondary | ICD-10-CM

## 2017-06-24 DIAGNOSIS — Y999 Unspecified external cause status: Secondary | ICD-10-CM | POA: Insufficient documentation

## 2017-06-24 DIAGNOSIS — W3400XA Accidental discharge from unspecified firearms or gun, initial encounter: Secondary | ICD-10-CM | POA: Insufficient documentation

## 2017-06-24 DIAGNOSIS — Y9389 Activity, other specified: Secondary | ICD-10-CM | POA: Insufficient documentation

## 2017-06-24 DIAGNOSIS — S61411A Laceration without foreign body of right hand, initial encounter: Secondary | ICD-10-CM | POA: Insufficient documentation

## 2017-06-24 MED ORDER — LIDOCAINE-EPINEPHRINE-TETRACAINE (LET) SOLUTION
3.0000 mL | Freq: Once | NASAL | Status: AC
Start: 2017-06-24 — End: 2017-06-24
  Administered 2017-06-24: 3 mL via TOPICAL

## 2017-06-24 MED ORDER — CEFAZOLIN SODIUM 1 G IJ SOLR: 1.0000 g | Freq: Once | INTRAMUSCULAR | Status: DC

## 2017-06-24 MED ORDER — SULFAMETHOXAZOLE-TRIMETHOPRIM 800-160 MG PO TABS: 1.0000 | ORAL_TABLET | Freq: Two times a day (BID) | ORAL | 0 refills | Status: DC

## 2017-06-24 MED ORDER — TETANUS-DIPHTH-ACELL PERTUSSIS 5-2.5-18.5 LF-MCG/0.5 IM SUSP
0.5000 mL | Freq: Once | INTRAMUSCULAR | Status: AC
  Administered 2017-06-24: 0.5 mL via INTRAMUSCULAR
  Filled 2017-06-24: qty 0.5

## 2017-06-24 MED ORDER — LIDOCAINE-EPINEPHRINE-TETRACAINE (LET) SOLUTION
NASAL | Status: AC
  Administered 2017-06-24: 3 mL
  Filled 2017-06-24: qty 3
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
## 2017-06-24 NOTE — ED Notes (Signed)
Patient left against medical advice.  Discharged out of the system.

## 2017-06-24 NOTE — Discharge Instructions (Signed)
Please read and follow all provided instructions.  Your diagnoses today is a laceration. A laceration is a cut or lesion that goes through all layers of the skin and into the tissue just beneath the skin. This was repaired with steri strips as it had been >12 hours since the injury. Follow up with hand specalist for further evalaution. I am placing you on antibotics. Please take all of your antibiotics until finished!   You may develop abdominal discomfort or diarrhea from the antibiotic.  You may help offset this with probiotics which you can buy or get in yogurt. Do not eat or take the probiotics until 2 hours after your antibiotic. Do not take your medicine if develop an itchy rash, swelling in your mouth or lips, or difficulty breathing. Keep the wound clean and dry for the next 24 hours and leave the dressing in place. You may shower after 24 hours. Do not soak the area for long periods of times as in a bath until the sutures are removed. Pat dry with clean towel. Do not scrub. Once the wound has healed, scarring can be minimized by covering the wound with sunscreen during the day for 1 full year.  Return instructions:  You have redness, swelling, or increasing pain in the wound.  You see a red line that goes away from the wound.  You have yellowish-white fluid (pus) coming from the wound.  You have a fever (above 100.3F) You notice a bad smell coming from the wound or dressing.  Your wound breaks open before or after sutures have been removed.  You notice something coming out of the wound such as wood or glass.  Your wound is on your hand or foot and you cannot move a finger or toe.  Your pain is not controlled with prescribed medicine.   Additional Information:  If you did not receive a tetanus shot today because you thought you were up to date, but did not recall when your last one was given, make sure to check with your primary caregiver to determine if you need one.   Your vital signs  today were: BP (!) 142/92 (BP Location: Left Arm)    Pulse (!) 105    Temp 98.5 F (36.9 C) (Oral)    Resp 16    Ht  (1.854 m)    Wt 72.8 kg (160 lb 9.6 oz)    SpO2 100%    BMI 21.19 kg/m  If your blood pressure (BP) was elevated above 135/85 this visit, please have this repeated by your doctor within one month.

## 2020-06-02 ENCOUNTER — Ambulatory Visit (HOSPITAL_COMMUNITY)
Admission: EM | Admit: 2020-06-02 | Discharge: 2020-06-02 | Disposition: A | Discharge status: Home / Self Care | Payer: Self-pay | Attending: Physician Assistant | Admitting: Physician Assistant

## 2020-06-02 ENCOUNTER — Other Ambulatory Visit: Payer: Self-pay

## 2020-06-02 ENCOUNTER — Encounter (HOSPITAL_COMMUNITY): Payer: Self-pay

## 2020-06-02 DIAGNOSIS — S76012A Strain of muscle, fascia and tendon of left hip, initial encounter: Secondary | ICD-10-CM

## 2020-06-02 DIAGNOSIS — S39012A Strain of muscle, fascia and tendon of lower back, initial encounter: Secondary | ICD-10-CM

## 2020-06-02 DIAGNOSIS — M7918 Myalgia, other site: Secondary | ICD-10-CM

## 2020-06-02 DIAGNOSIS — S46812A Strain of other muscles, fascia and tendons at shoulder and upper arm level, left arm, initial encounter: Secondary | ICD-10-CM

## 2020-06-02 MED ORDER — TIZANIDINE HCL 4 MG PO TABS: 4.0000 mg | ORAL_TABLET | Freq: Every day | ORAL | 0 refills | Status: AC

## 2020-06-02 MED ORDER — ACETAMINOPHEN 500 MG PO TABS: 1000.0000 mg | ORAL_TABLET | Freq: Three times a day (TID) | ORAL | 0 refills | Status: DC | PRN
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
## 2020-06-02 NOTE — Discharge Instructions (Signed)
Medications as prescribed -Tylenol every 8 hours -Zanaflex/tizanidine which is the muscle relaxer at night.  This will make you sleepy, do not drive, drink alcohol or operate machinery within 8 hours  Call the sports medicine number today for close follow-up of your hip flexor strain  If severe symptoms of, severe abdominal pain, severe headache, dizziness, numbness, weakness, go to the emergency department.

## 2020-06-05 ENCOUNTER — Ambulatory Visit (INDEPENDENT_AMBULATORY_CARE_PROVIDER_SITE_OTHER): Payer: Self-pay | Admitting: Family Medicine

## 2020-06-05 ENCOUNTER — Other Ambulatory Visit: Payer: Self-pay

## 2020-06-05 ENCOUNTER — Encounter: Payer: Self-pay | Admitting: Family Medicine

## 2020-06-05 ENCOUNTER — Ambulatory Visit (HOSPITAL_BASED_OUTPATIENT_CLINIC_OR_DEPARTMENT_OTHER)
Admission: RE | Admit: 2020-06-05 | Discharge: 2020-06-05 | Disposition: A | Discharge status: Home / Self Care | Payer: Self-pay | Source: Ambulatory Visit | Attending: Family Medicine | Admitting: Family Medicine

## 2020-06-05 ENCOUNTER — Ambulatory Visit: Payer: Self-pay

## 2020-06-05 ENCOUNTER — Telehealth: Payer: Self-pay | Admitting: Family Medicine

## 2020-06-05 VITALS — BP 111/74 | HR 82 | Ht 73.0 in | Wt 145.0 lb

## 2020-06-05 DIAGNOSIS — M25552 Pain in left hip: Secondary | ICD-10-CM

## 2020-06-05 DIAGNOSIS — M169 Osteoarthritis of hip, unspecified: Secondary | ICD-10-CM | POA: Insufficient documentation

## 2020-06-05 DIAGNOSIS — M79605 Pain in left leg: Secondary | ICD-10-CM
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
## 2020-06-05 NOTE — Patient Instructions (Signed)
Nice to meet you Please try the crutches  Please try to alternate heat and ice  I will call with the results from today   Please send me a message in MyChart with any questions or updates.  Please see me back in 1-2 weeks.   --Dr. Jordan Likes
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
## 2020-06-07 ENCOUNTER — Telehealth: Payer: Self-pay | Admitting: Family Medicine

## 2020-06-07 NOTE — Telephone Encounter (Signed)
Pt given MRI results by med asst but doesn't understand what they mean & he ask that Dr. Jordan Likes contact him @ [phone] for further discussion   ---Forwarding message to provider.  --glh
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
## 2020-06-07 NOTE — Telephone Encounter (Signed)
Answered his questions about the xray.   Myra Rude, MD [facility] 06/07/2020, 4:23 PM

## 2020-06-14 ENCOUNTER — Ambulatory Visit (INDEPENDENT_AMBULATORY_CARE_PROVIDER_SITE_OTHER): Payer: Self-pay | Admitting: Family Medicine

## 2020-06-14 ENCOUNTER — Encounter: Payer: Self-pay | Admitting: Family Medicine

## 2020-06-14 ENCOUNTER — Other Ambulatory Visit: Payer: Self-pay

## 2020-06-14 DIAGNOSIS — M25552 Pain in left hip: Secondary | ICD-10-CM

## 2020-06-14 MED ORDER — PREDNISONE 5 MG PO TABS
ORAL_TABLET | ORAL | 0 refills | Status: DC
Start: 2020-06-14 — End: 2021-05-22

## 2020-06-14 NOTE — Progress Notes (Signed)
RENALD King - 29 y.o. male MRN [id number]  Date of birth: [birth year]  SUBJECTIVE:  Including CC & ROS.  Chief Complaint  Patient presents with  . Follow-up    left hip    [name] is a 29 y.o. male that is following up for his left hip pain.  He is still having some pain with ambulation and that seems to be occurring over the proximal lateral hip.  Has been using crutches.  Is able to have some time on his feet without pain.   Review of Systems See HPI   HISTORY: Past Medical, Surgical, Social, and Family History Reviewed & Updated per EMR.   Pertinent Historical Findings include:  Past Medical History:  Diagnosis Date  . Brief psychotic disorder (HCC)   . Cerebral concussion   . Crohn's   . Hard of hearing   . Tics of organic origin   . Weight loss     Past Surgical History:  Procedure Laterality Date  . KNEE SURGERY      Family History  Problem Relation Age of Onset  . Bipolar disorder Father   . Alcohol abuse Father   . Hypertension Mother   . Prostate cancer Paternal Grandfather   . Colon cancer Neg Hx     Social History   Socioeconomic History  . Marital status: Single    Spouse name: Not on file  . Number of children: 1  . Years of education: Not on file  . Highest education level: Not on file  Occupational History  . Occupation: Temp Work   Tobacco Use  . Smoking status: Former Games developer  . Smokeless tobacco: Never Used  Substance and Sexual Activity  . Alcohol use: Yes    Comment: occ  . Drug use: Not Currently    Frequency: 5.0 times per week    Types: Marijuana  . Sexual activity: Not on file  Other Topics Concern  . Not on file  Social History Narrative  . Not on file   Social Determinants of Health   Financial Resource Strain:   . Difficulty of Paying Living Expenses: Not on file  Food Insecurity:   . Worried About Programme researcher, broadcasting/film/video in the Last Year: Not on file  . Ran Out of Food in the Last Year: Not on file    Transportation Needs:   . Lack of Transportation (Medical): Not on file  . Lack of Transportation (Non-Medical): Not on file  Physical Activity:   . Days of Exercise per Week: Not on file  . Minutes of Exercise per Session: Not on file  Stress:   . Feeling of Stress : Not on file  Social Connections:   . Frequency of Communication with Friends and Family: Not on file  . Frequency of Social Gatherings with Friends and Family: Not on file  . Attends Religious Services: Not on file  . Active Member of Clubs or Organizations: Not on file  . Attends Banker Meetings: Not on file  . Marital Status: Not on file  Intimate Partner Violence:   . Fear of Current or Ex-Partner: Not on file  . Emotionally Abused: Not on file  . Physically Abused: Not on file  . Sexually Abused: Not on file     PHYSICAL EXAM:  VS: BP 111/75   Pulse 76   Ht 6\' 1"  (1.854 m)   Wt 155 lb (70.3 kg)   BMI 20.45 kg/m  Physical Exam Gen: NAD,  alert, cooperative with exam, well-appearing     ASSESSMENT & PLAN:   Left hip pain Pain is still occurring over the proximal lateral hip.  Having altered sensation along the lateral aspect of the leg as well.  Does not appear to be joint related as he does have good range of motion. -Counseled on partial weightbearing. -Provided work note. -Prednisone. -If no improvement will consider MRI to evaluate for possible pelvic occult fracture.

## 2020-06-14 NOTE — Assessment & Plan Note (Signed)
Pain is still occurring over the proximal lateral hip.  Having altered sensation along the lateral aspect of the leg as well.  Does not appear to be joint related as he does have good range of motion. -Counseled on partial weightbearing. -Provided work note. -Prednisone. -If no improvement will consider MRI to evaluate for possible pelvic occult fracture.

## 2020-06-14 NOTE — Patient Instructions (Signed)
Good to see you Please continue the partial weightbearing  Please continue the range of motion  Please try the medicine   Please send me a message in MyChart with any questions or updates.  Please see me back in 3 weeks Please call me after you have finished the prednisone to let me know if your pain has improved or not. .   --Dr. Jordan Likes

## 2020-07-06 ENCOUNTER — Other Ambulatory Visit: Payer: Self-pay

## 2020-07-06 ENCOUNTER — Telehealth: Payer: Self-pay | Admitting: Family Medicine

## 2020-07-06 ENCOUNTER — Ambulatory Visit (INDEPENDENT_AMBULATORY_CARE_PROVIDER_SITE_OTHER): Payer: Self-pay | Admitting: Family Medicine

## 2020-07-06 ENCOUNTER — Encounter: Payer: Self-pay | Admitting: Family Medicine

## 2020-07-06 VITALS — BP 136/86 | HR 92 | Ht 73.0 in | Wt 150.0 lb

## 2020-07-06 DIAGNOSIS — M25552 Pain in left hip: Secondary | ICD-10-CM

## 2020-07-06 NOTE — Patient Instructions (Signed)
Good to see you  Please send me a message in MyChart with any questions or updates.  We will setup a virtual visit once the MRI is resulted.   --Dr. Armando Lauman  
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
## 2020-07-17 ENCOUNTER — Other Ambulatory Visit: Payer: Self-pay

## 2020-07-17 ENCOUNTER — Ambulatory Visit (INDEPENDENT_AMBULATORY_CARE_PROVIDER_SITE_OTHER): Payer: Self-pay

## 2020-07-17 DIAGNOSIS — M25552 Pain in left hip: Secondary | ICD-10-CM

## 2020-07-17 DIAGNOSIS — M87052 Idiopathic aseptic necrosis of left femur: Secondary | ICD-10-CM

## 2020-07-18 ENCOUNTER — Telehealth: Payer: Self-pay | Admitting: Family Medicine

## 2020-07-18 ENCOUNTER — Telehealth (INDEPENDENT_AMBULATORY_CARE_PROVIDER_SITE_OTHER): Payer: Self-pay | Admitting: Family Medicine

## 2020-07-18 DIAGNOSIS — M1612 Unilateral primary osteoarthritis, left hip: Secondary | ICD-10-CM
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
## 2020-07-18 NOTE — Telephone Encounter (Signed)
Patient called states saw MRI results thru mychart & is very concerned doesn't know what they means (advised pt I ws just about to call hm to schedule a virtual OV w/Dr.Schmitz for result review)  --Advised pt Dr.Schmitz will call him at agreed appointment time & discuss.  --glh
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
## 2020-07-20 ENCOUNTER — Other Ambulatory Visit: Payer: Self-pay

## 2020-07-20 ENCOUNTER — Encounter: Payer: Self-pay | Admitting: Family Medicine

## 2020-07-20 ENCOUNTER — Ambulatory Visit (INDEPENDENT_AMBULATORY_CARE_PROVIDER_SITE_OTHER): Payer: Self-pay | Admitting: Family Medicine

## 2020-07-20 ENCOUNTER — Ambulatory Visit: Payer: Self-pay

## 2020-07-20 VITALS — BP 122/80 | HR 90 | Ht 73.0 in | Wt 150.0 lb

## 2020-07-20 DIAGNOSIS — M1612 Unilateral primary osteoarthritis, left hip: Secondary | ICD-10-CM

## 2020-07-20 MED ORDER — TRIAMCINOLONE ACETONIDE 40 MG/ML IJ SUSP
40.0000 mg | Freq: Once | INTRAMUSCULAR | Status: AC
Start: 2020-07-20 — End: 2020-07-20
  Administered 2020-07-20: 40 mg via INTRA_ARTICULAR

## 2020-07-20 NOTE — Addendum Note (Signed)
Addended by: Kathi Simpers F on: 07/20/2020 04:29 PM   Modules accepted: Orders
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
## 2020-07-20 NOTE — Patient Instructions (Addendum)
Good to see you Please use ice as needed  Please use tylenol or ibuprofen as needed   Physical therapy will give you a call.  Please send me a message in MyChart with any questions or updates.  Please see me back in 4 weeks.   --Dr. Jordan Likes

## 2020-07-31 ENCOUNTER — Ambulatory Visit: Payer: Self-pay | Admitting: Physical Therapy

## 2020-08-03 ENCOUNTER — Other Ambulatory Visit: Payer: Self-pay

## 2020-08-03 ENCOUNTER — Ambulatory Visit: Payer: Self-pay | Attending: Family Medicine | Admitting: Physical Therapy

## 2020-08-03 ENCOUNTER — Encounter: Payer: Self-pay | Admitting: Physical Therapy

## 2020-08-03 DIAGNOSIS — M25652 Stiffness of left hip, not elsewhere classified: Secondary | ICD-10-CM | POA: Insufficient documentation

## 2020-08-03 DIAGNOSIS — R262 Difficulty in walking, not elsewhere classified: Secondary | ICD-10-CM | POA: Insufficient documentation

## 2020-08-03 DIAGNOSIS — M25552 Pain in left hip: Secondary | ICD-10-CM | POA: Insufficient documentation

## 2020-08-03 DIAGNOSIS — M6281 Muscle weakness (generalized): Secondary | ICD-10-CM | POA: Insufficient documentation

## 2020-08-03 DIAGNOSIS — R2689 Other abnormalities of gait and mobility: Secondary | ICD-10-CM | POA: Insufficient documentation
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
## 2020-08-07 ENCOUNTER — Ambulatory Visit (INDEPENDENT_AMBULATORY_CARE_PROVIDER_SITE_OTHER): Payer: Self-pay | Admitting: Family Medicine

## 2020-08-07 ENCOUNTER — Other Ambulatory Visit: Payer: Self-pay

## 2020-08-07 ENCOUNTER — Encounter: Payer: Self-pay | Admitting: Family Medicine

## 2020-08-07 VITALS — BP 132/94 | HR 94 | Ht 71.0 in | Wt 160.0 lb

## 2020-08-07 DIAGNOSIS — M1612 Unilateral primary osteoarthritis, left hip: Secondary | ICD-10-CM

## 2020-08-07 NOTE — Patient Instructions (Signed)
Good to see you  Please take tylenol  Please avoid loading of the left hip  The orthopedists office will give you a call  Please send me a message in MyChart with any questions or updates.  Please see [place] as needed.   --Dr. Jordan Likes

## 2020-08-07 NOTE — Progress Notes (Signed)
[name] - 29 y.o. male MRN [id number]  Date of birth: 1991-09-30  SUBJECTIVE:  Including CC & ROS.  Chief Complaint  Patient presents with  . Follow-up    left hip    [name] is a 29 y.o. male that is following up for his left hip pain.  He did get improvement with the steroid injection but the pain has returned and seems worse.  He has pain with getting in and out of his car.  He has trouble lifting or bending.   Review of Systems See HPI   HISTORY: Past Medical, Surgical, Social, and Family History Reviewed & Updated per EMR.   Pertinent Historical Findings include:  Past Medical History:  Diagnosis Date  . Brief psychotic disorder (HCC)   . Cerebral concussion   . Crohn's   . Hard of hearing   . Tics of organic origin   . Weight loss     Past Surgical History:  Procedure Laterality Date  . KNEE SURGERY      Family History  Problem Relation Age of Onset  . Bipolar disorder Father   . Alcohol abuse Father   . Hypertension Mother   . Prostate cancer Paternal Grandfather   . Colon cancer Neg Hx     Social History   Socioeconomic History  . Marital status: Single    Spouse name: Not on file  . Number of children: 1  . Years of education: Not on file  . Highest education level: Not on file  Occupational History  . Occupation: Temp Work   Tobacco Use  . Smoking status: Former Games developer  . Smokeless tobacco: Never Used  Substance and Sexual Activity  . Alcohol use: Yes    Comment: occ  . Drug use: Not Currently    Frequency: 5.0 times per week    Types: Marijuana  . Sexual activity: Not on file  Other Topics Concern  . Not on file  Social History Narrative  . Not on file   Social Determinants of Health   Financial Resource Strain:   . Difficulty of Paying Living Expenses: Not on file  Food Insecurity:   . Worried About Programme researcher, broadcasting/film/video in the Last Year: Not on file  . Ran Out of Food in the Last Year: Not on file  Transportation  Needs:   . Lack of Transportation (Medical): Not on file  . Lack of Transportation (Non-Medical): Not on file  Physical Activity:   . Days of Exercise per Week: Not on file  . Minutes of Exercise per Session: Not on file  Stress:   . Feeling of Stress : Not on file  Social Connections:   . Frequency of Communication with Friends and Family: Not on file  . Frequency of Social Gatherings with Friends and Family: Not on file  . Attends Religious Services: Not on file  . Active Member of Clubs or Organizations: Not on file  . Attends Banker Meetings: Not on file  . Marital Status: Not on file  Intimate Partner Violence:   . Fear of Current or Ex-Partner: Not on file  . Emotionally Abused: Not on file  . Physically Abused: Not on file  . Sexually Abused: Not on file     PHYSICAL EXAM:  VS: BP (!) 132/94   Pulse 94   Ht 5\' 11"  (1.803 m)   Wt 160 lb (72.6 kg)   BMI 22.32 kg/m  Physical Exam Gen: NAD, alert,  cooperative with exam, well-appearing   ASSESSMENT & PLAN:   OA (osteoarthritis) of hip Pain has returned. It can be severe at times. He did get improvement from the injection for about a week.  -Counseled on supportive care. -Counseled Tylenol and ibuprofen. -Referral to orthopedic surgery.

## 2020-08-07 NOTE — Assessment & Plan Note (Signed)
Pain has returned. It can be severe at times. He did get improvement from the injection for about a week.  -Counseled on supportive care. -Counseled Tylenol and ibuprofen. -Referral to orthopedic surgery.

## 2020-08-08 ENCOUNTER — Ambulatory Visit: Payer: Self-pay

## 2020-08-10 ENCOUNTER — Ambulatory Visit: Payer: Self-pay | Attending: Family Medicine

## 2020-08-10 ENCOUNTER — Other Ambulatory Visit: Payer: Self-pay

## 2020-08-10 DIAGNOSIS — R262 Difficulty in walking, not elsewhere classified: Secondary | ICD-10-CM

## 2020-08-10 DIAGNOSIS — M25552 Pain in left hip: Secondary | ICD-10-CM

## 2020-08-10 DIAGNOSIS — M25652 Stiffness of left hip, not elsewhere classified: Secondary | ICD-10-CM

## 2020-08-10 DIAGNOSIS — M6281 Muscle weakness (generalized): Secondary | ICD-10-CM

## 2020-08-10 DIAGNOSIS — R2689 Other abnormalities of gait and mobility: Secondary | ICD-10-CM
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
## 2020-08-11 ENCOUNTER — Encounter (HOSPITAL_COMMUNITY): Payer: Self-pay | Admitting: Emergency Medicine

## 2020-08-11 ENCOUNTER — Emergency Department (HOSPITAL_COMMUNITY)
Admission: EM | Admit: 2020-08-11 | Discharge: 2020-08-11 | Disposition: A | Discharge status: Home / Self Care | Payer: Self-pay | Attending: Emergency Medicine | Admitting: Emergency Medicine

## 2020-08-11 DIAGNOSIS — F419 Anxiety disorder, unspecified: Secondary | ICD-10-CM | POA: Insufficient documentation

## 2020-08-11 DIAGNOSIS — Z5321 Procedure and treatment not carried out due to patient leaving prior to being seen by health care provider: Secondary | ICD-10-CM | POA: Insufficient documentation

## 2020-08-11 NOTE — ED Triage Notes (Signed)
Pt states he was sitting at home with a friend and suddenly began to feel jittering and anxiety he denies any CP or shortness. States this has never happened before. Denies any drug use.

## 2020-08-11 NOTE — ED Notes (Signed)
Unable to locate in lobby. 

## 2020-08-11 NOTE — ED Notes (Signed)
Pt request IV be removed

## 2020-08-15 ENCOUNTER — Ambulatory Visit: Payer: Self-pay

## 2020-08-17 ENCOUNTER — Ambulatory Visit: Payer: Self-pay

## 2020-08-21 ENCOUNTER — Ambulatory Visit: Payer: Self-pay | Admitting: Family Medicine

## 2020-08-21 ENCOUNTER — Encounter: Payer: Self-pay | Admitting: Family Medicine

## 2020-08-22 ENCOUNTER — Ambulatory Visit: Payer: Self-pay

## 2020-08-24 ENCOUNTER — Ambulatory Visit: Payer: Self-pay | Admitting: Physical Therapy

## 2020-08-28 ENCOUNTER — Encounter: Payer: Self-pay | Admitting: Physical Therapy

## 2020-09-14 ENCOUNTER — Encounter: Payer: Self-pay | Admitting: Physical Therapy

## 2020-10-17 ENCOUNTER — Emergency Department (HOSPITAL_COMMUNITY)
Admission: EM | Admit: 2020-10-17 | Discharge: 2020-10-17 | Disposition: A | Discharge status: Home / Self Care | Payer: HRSA Program | Attending: Emergency Medicine | Admitting: Emergency Medicine

## 2020-10-17 ENCOUNTER — Other Ambulatory Visit: Payer: Self-pay

## 2020-10-17 DIAGNOSIS — Z87891 Personal history of nicotine dependence: Secondary | ICD-10-CM | POA: Diagnosis not present

## 2020-10-17 DIAGNOSIS — Z20822 Contact with and (suspected) exposure to covid-19: Secondary | ICD-10-CM

## 2020-10-17 DIAGNOSIS — U071 COVID-19: Secondary | ICD-10-CM | POA: Diagnosis not present

## 2020-10-17 DIAGNOSIS — R519 Headache, unspecified: Secondary | ICD-10-CM | POA: Diagnosis present

## 2020-10-17 LAB — SARS CORONAVIRUS 2 (TAT 6-24 HRS): SARS Coronavirus 2: POSITIVE — AB

## 2020-10-17 MED ORDER — ACETAMINOPHEN 500 MG PO TABS
1000.0000 mg | ORAL_TABLET | Freq: Once | ORAL | Status: AC
  Administered 2020-10-17: 1000 mg via ORAL
  Filled 2020-10-17: qty 2

## 2020-10-17 NOTE — ED Provider Notes (Signed)
[facility] EMERGENCY DEPARTMENT Provider Note   CSN: [id number] Arrival date & time: 10/17/20  1649     History Chief Complaint  Patient presents with  . Headache    [name] is a 30 y.o. male presenting for evaluation of headache and fever.  Patient states he woke up this afternoon around 3:00, he had a headache and was found to be febrile. He took a home COVID test, which she told me was negative, but told the nurse was positive. He reports no other symptoms including nasal congestion, sore throat, chest pain, shortness of breath, cough, nausea, vomiting, abdominal pain. He is not vaccinated for COVID. He denies sick contacts. He has no other medical problems, takes no medications daily. Patient had hip surgery last week, no increased pain, redness, or drainage from the area.  HPI     Past Medical History:  Diagnosis Date  . Brief psychotic disorder (HCC)   . Cerebral concussion   . Crohn's   . Hard of hearing   . Tics of organic origin   . Weight loss     Patient Active Problem List   Diagnosis Date Noted  . OA (osteoarthritis) of hip 06/05/2020  . Medical non-compliance 12/02/2011  . Crohn disease (HCC) 07/01/2011    Past Surgical History:  Procedure Laterality Date  . KNEE SURGERY         Family History  Problem Relation Age of Onset  . Bipolar disorder Father   . Alcohol abuse Father   . Hypertension Mother   . Prostate cancer Paternal Grandfather   . Colon cancer Neg Hx     Social History   Tobacco Use  . Smoking status: Former Games developer  . Smokeless tobacco: Never Used  Substance Use Topics  . Alcohol use: Yes    Comment: occ  . Drug use: Not Currently    Frequency: 5.0 times per week    Types: Marijuana    Home Medications Prior to Admission medications   Medication Sig Start Date End Date Taking? Authorizing Provider  acetaminophen (TYLENOL) 500 MG tablet Take 2 tablets (1,000 mg total) by mouth every 8 (eight)  hours as needed for moderate pain. 06/02/20   Darr, Gerilyn Pilgrim, PA-C  predniSONE (DELTASONE) 5 MG tablet Take 6 pills for first day, 5 pills second day, 4 pills third day, 3 pills fourth day, 2 pills the fifth day, and 1 pill sixth day. 06/14/20   Myra Rude, MD  sulfamethoxazole-trimethoprim (BACTRIM DS,SEPTRA DS) 800-160 MG tablet Take 1 tablet by mouth 2 (two) times daily. 06/24/17   Maczis, Elmer Sow, PA-C    Allergies    Ibuprofen  Review of Systems   Review of Systems  Constitutional: Positive for fever.  Neurological: Positive for headaches.  All other systems reviewed and are negative.   Physical Exam Updated Vital Signs BP 121/60   Pulse (!) 108   Temp 99.1 F (37.3 C) (Oral)   Resp 19   Ht 6' (1.829 m)   Wt 68 kg   SpO2 100%   BMI 20.34 kg/m   Physical Exam Vitals and nursing note reviewed.  Constitutional:      General: He is not in acute distress.    Appearance: He is well-developed and well-nourished.     Comments: Resting in the bed in no acute distress  HENT:     Head: Normocephalic and atraumatic.  Eyes:     Extraocular Movements: Extraocular movements intact and EOM normal.  Conjunctiva/sclera: Conjunctivae normal.     Pupils: Pupils are equal, round, and reactive to light.  Cardiovascular:     Rate and Rhythm: Normal rate and regular rhythm.     Pulses: Normal pulses and intact distal pulses.     Comments: Heart rate normal, in the 90s, on my exam Pulmonary:     Effort: Pulmonary effort is normal. No respiratory distress.     Breath sounds: Normal breath sounds. No wheezing.     Comments: Speaking in full sentences. Clear lung sounds in all fields. Sats stable on room air. Abdominal:     General: Bowel sounds are normal. There is no distension.     Palpations: Abdomen is soft.     Tenderness: There is no abdominal tenderness.  Musculoskeletal:        General: Normal range of motion.     Cervical back: Normal range of motion and neck supple.      Right lower leg: No edema.     Left lower leg: No edema.  Skin:    General: Skin is warm and dry.     Capillary Refill: Capillary refill takes less than 2 seconds.     Comments: Surgical site of left hip without erythema, warmth, tenderness, or drainage.  Neurological:     Mental Status: He is alert and oriented to person, place, and time.  Psychiatric:        Mood and Affect: Mood and affect normal.     ED Results / Procedures / Treatments   Labs (all labs ordered are listed, but only abnormal results are displayed) Labs Reviewed  SARS CORONAVIRUS 2 (TAT 6-24 HRS)    EKG None  Radiology No results found.  Procedures Procedures (including critical care time)  Medications Ordered in ED Medications  acetaminophen (TYLENOL) tablet 1,000 mg (1,000 mg Oral Given 10/17/20 1721)    ED Course  I have reviewed the triage vital signs and the nursing notes.  Pertinent labs & imaging results that were available during my care of the patient were reviewed by me and considered in my medical decision making (see chart for details).    MDM Rules/Calculators/A&P                          Patient presented for evaluation of headache and fever. On exam, he appears nontoxic. He took a home COVID test, although reports different results. He received Tylenol in the waiting room, this resolved his fever and headache. He has no symptoms on my evaluation. I discussed likely COVID infection and importance of quarantine. Discussed continued monitoring for respiratory status and symptomatic management at home. At this time, patient appears safe for discharge. Return precautions given. Patient states he understands and agrees to plan.  [name] was evaluated in Emergency Department on 10/17/2020 for the symptoms described in the history of present illness. He was evaluated in the context of the global COVID-19 pandemic, which necessitated consideration that the patient might be at risk for  infection with the SARS-CoV-2 virus that causes COVID-19. Institutional protocols and algorithms that pertain to the evaluation of patients at risk for COVID-19 are in a state of rapid change based on information released by regulatory bodies including the CDC and federal and state organizations. These policies and algorithms were followed during the patient's care in the ED.  Final Clinical Impression(s) / ED Diagnoses Final diagnoses:  Suspected COVID-19 virus infection    Rx / DC  Orders ED Discharge Orders    None       Alveria Apley, PA-C 10/17/20 1956    Benjiman Core, MD 10/17/20 2336

## 2020-10-17 NOTE — Discharge Instructions (Signed)
You were tested for Covid.  Results should return in 6 to 24 hours.  You will receive a phone call if it is positive.  You will not hear anything if it is negative.  Either way, you may check online on MyChart. Regardless, this is likely a viral illness, which should be treated symptomatically.  Use Tylenol as needed for fevers, headaches, or body aches.  Use cough syrup as needed.  Make sure you stay well-hydrated with water.  Wash your hands frequently to prevent spread of infection.    If your test is positive, you will need to quarantine for a total of 5 days from symptom onset.  You may end quarantine if you are fever free and your symptoms are improving, however it is extremely important that you wear a mask for an additional 5 days at all times. If you are not fever free your symptoms are not improving, you will need to quarantine until this is the case         Return to the emergency room if you develop chest pain, difficulty breathing, or any new or worsening symptoms.

## 2020-10-17 NOTE — ED Triage Notes (Signed)
Pt had positive covid test today. Denies complaints other than fever and headache.

## 2020-10-18 ENCOUNTER — Ambulatory Visit: Payer: Self-pay | Admitting: *Deleted

## 2020-10-18 NOTE — Telephone Encounter (Signed)
Patient notified of positive COVID-19 test results. Pt verbalized understanding. Pt reports symptoms of headache. Criteria for self-isolation if you test positive for COVID-19, regardless of vaccination status:  -If you have mild symptoms that are resolving or have resolved, isolate at home for 5 days since symptoms started AND continue to wear a well-fitted mask when around others in the home and in public for 5 additional days after isolation is completed -If you have a fever and/or moderate to severe symptoms, isolate for at least 10 days since the symptoms started AND until you are fever free for at least 24 hours without the use of fever-reducing medications -If you tested positive and did not have symptoms, isolate for at least 5 days after your positive test  Use over-the-counter medications for symptoms.If you develop respiratory issues/distress, seek medical care in the Emergency Department.  If you must leave home or if you have to be around others please wear a mask. Please limit contact with immediate family members in the home, practice social distancing, frequent handwashing and clean hard surfaces touched frequently with household cleaning products. Members of your household will also need to quarantine and test.You may also be contacted by the health department for follow up. [facility] Department notified.

## 2021-05-22 ENCOUNTER — Encounter (HOSPITAL_COMMUNITY): Payer: Self-pay | Admitting: Emergency Medicine

## 2021-05-22 ENCOUNTER — Other Ambulatory Visit: Payer: Self-pay

## 2021-05-22 ENCOUNTER — Emergency Department (HOSPITAL_COMMUNITY)
Admission: EM | Admit: 2021-05-22 | Discharge: 2021-05-22 | Disposition: A | Discharge status: Home / Self Care | Payer: No Typology Code available for payment source | Attending: Emergency Medicine | Admitting: Emergency Medicine

## 2021-05-22 ENCOUNTER — Emergency Department (HOSPITAL_COMMUNITY): Payer: No Typology Code available for payment source

## 2021-05-22 DIAGNOSIS — M25552 Pain in left hip: Secondary | ICD-10-CM | POA: Diagnosis not present

## 2021-05-22 DIAGNOSIS — Z87891 Personal history of nicotine dependence: Secondary | ICD-10-CM | POA: Insufficient documentation

## 2021-05-22 DIAGNOSIS — X500XXA Overexertion from strenuous movement or load, initial encounter: Secondary | ICD-10-CM | POA: Diagnosis not present

## 2021-05-22 MED ORDER — ACETAMINOPHEN 325 MG PO TABS
650.0000 mg | ORAL_TABLET | Freq: Once | ORAL | Status: AC
  Administered 2021-05-22: 650 mg via ORAL
  Filled 2021-05-22: qty 2

## 2021-05-22 MED ORDER — PREDNISONE 20 MG PO TABS: 40.0000 mg | ORAL_TABLET | Freq: Every day | ORAL | 0 refills | Status: DC

## 2021-05-22 NOTE — ED Provider Notes (Signed)
Emergency Medicine Provider Triage Evaluation Note  [name] , a 30 y.o. male  was evaluated in triage.  Pt complains of left hip pain for the last 4 days.  Pain radiates down the leg to his left knee.  No back pain, numbness, or weakness.  He has had difficulty walking due to the pain and has been unable to work.  No treatment prior to arrival.  No recent falls or injuries.  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  He underwent cord compression surgery in January.  Orthopedics had recommended hip replacement, but patient was attempting to hold off as long as possible.  Review of Systems  Positive: Arthralgias, myalgias Negative: Fever, chills, numbness, weakness, redness, rash, back pain  Physical Exam  BP 115/80 (BP Location: Right Arm)   Pulse (!) 117   Temp 98 F (36.7 C)   Resp 16   SpO2 97%  Gen:   Awake, no distress   Resp:  Normal effort  MSK:   Moves extremities without difficulty  Other:  Focally tender to palpation over the left lateral hip.  Sensation is intact to the bilateral lower extremities.  Medical Decision Making  Medically screening exam initiated at 1:19 AM.  Appropriate orders placed.  [name] was informed that the remainder of the evaluation will be completed by another provider, this initial triage assessment does not replace that evaluation, and the importance of remaining in the ED until their evaluation is complete.  Imaging has been ordered.  Tylenol given for pain in triage.  He will require further work-up and evaluation.   Barkley Boards, PA-C 05/22/21 0119    Glynn Octave, MD 05/22/21 [phone]

## 2021-05-22 NOTE — ED Triage Notes (Signed)
Pt reports of left hip pain X3 days.  Reports prior hx of surgery on same hip.  Denies any recent falls or obvious injuries.

## 2021-05-22 NOTE — Discharge Instructions (Addendum)
There are no signs of broken bones today but avascular necrosis is still present.  You have most likely just irritated things from all the furniture moving last week.  Rest today.  Start the steroids and use tylenol extra strength 2 tabs for pain.
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
## 2021-05-28 ENCOUNTER — Other Ambulatory Visit: Payer: Self-pay

## 2021-05-28 ENCOUNTER — Ambulatory Visit (INDEPENDENT_AMBULATORY_CARE_PROVIDER_SITE_OTHER): Payer: Self-pay | Admitting: Family Medicine

## 2021-05-28 ENCOUNTER — Encounter: Payer: Self-pay | Admitting: Family Medicine

## 2021-05-28 DIAGNOSIS — M25559 Pain in unspecified hip: Secondary | ICD-10-CM

## 2021-05-28 NOTE — Patient Instructions (Signed)
   Ask human resources (HR) person for FMLA forms.
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
## 2021-07-20 ENCOUNTER — Encounter: Payer: Self-pay | Admitting: Orthopaedic Surgery

## 2021-07-20 ENCOUNTER — Ambulatory Visit (INDEPENDENT_AMBULATORY_CARE_PROVIDER_SITE_OTHER): Payer: Self-pay | Admitting: Orthopaedic Surgery

## 2021-07-20 ENCOUNTER — Other Ambulatory Visit: Payer: Self-pay

## 2021-07-20 DIAGNOSIS — M87052 Idiopathic aseptic necrosis of left femur: Secondary | ICD-10-CM | POA: Insufficient documentation

## 2021-07-20 MED ORDER — TRAMADOL HCL 50 MG PO TABS: 50.0000 mg | ORAL_TABLET | Freq: Every day | ORAL | 0 refills | Status: DC | PRN
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
## 2021-07-23 ENCOUNTER — Telehealth: Payer: Self-pay | Admitting: Orthopaedic Surgery

## 2021-07-23 NOTE — Telephone Encounter (Signed)
Paper work left on [employer] to fill out. I will add date to form.

## 2021-07-23 NOTE — Telephone Encounter (Signed)
On top of the work paperwork he dropped off this morning for Dr. Roda Shutters to fill out, pt is also asking for a note stating the exact date of his surg to give to his job. The best call back number is [phone].

## 2021-07-24 ENCOUNTER — Encounter: Payer: Self-pay | Admitting: Orthopaedic Surgery

## 2021-07-25 NOTE — Telephone Encounter (Signed)
Patient picked up.

## 2021-08-08 ENCOUNTER — Ambulatory Visit: Payer: Self-pay | Admitting: Orthopaedic Surgery

## 2021-08-24 NOTE — Progress Notes (Signed)
Surgical Instructions    Your procedure is scheduled on 09/03/21.  Report to [facility] Main Entrance "A" at 7:45 A.M., then check in with the Admitting office.  Call this number if you have problems the morning of surgery:  [phone]   If you have any questions prior to your surgery date call [phone]: Open Monday-Friday 8am-4pm    Remember:  Do not eat after midnight the night before your surgery  You may drink clear liquids until 6:45am the morning of your surgery.   Clear liquids allowed are: Water, Non-Citrus Juices (without pulp), Carbonated Beverages, Clear Tea, Black Coffee ONLY (NO MILK, CREAM OR POWDERED CREAMER of any kind), and Gatorade  Patient Instructions  The night before surgery:  No food after midnight. ONLY clear liquids after midnight  The day of surgery (if you do NOT have diabetes):  Drink ONE (1) Pre-Surgery Clear Ensure by 6:45am the morning of surgery. Drink in one sitting. Do not sip.  This drink was given to you during your hospital  pre-op appointment visit. Nothing else to drink after completing the  Pre-Surgery Clear Ensure.           If you have questions, please contact your surgeon's office.     Take these medicines the morning of surgery with A SIP OF WATER: NONE   As of today, STOP taking any Aspirin (unless otherwise instructed by your surgeon) Aleve, Naproxen, Ibuprofen, Motrin, Advil, Goody's, BC's, all herbal medications, fish oil, and all vitamins.     After your COVID test   You are not required to quarantine however you are required to wear a well-fitting mask when you are out and around people not in your household.  If your mask becomes wet or soiled, replace with a new one.  Wash your hands often with soap and water for 20 seconds or clean your hands with an alcohol-based hand sanitizer that contains at least 60% alcohol.  Do not share personal items.  Notify your provider: if you are in close contact with someone  who has COVID  or if you develop a fever of 100.4 or greater, sneezing, cough, sore throat, shortness of breath or body aches.             Do not wear jewelry or makeup Do not wear lotions, powders, perfumes/colognes, or deodorant. Men may shave face and neck. Do not bring valuables to the hospital. DO Not wear nail polish, gel polish, artificial nails, or any other type of covering on natural nails including finger and toenails. If patients have artificial nails, gel coating, etc. that need to be removed by a nail salon, please have this removed prior to surgery or surgery may need to be canceled/delayed if the surgeon/ anesthesia feels like the patient is unable to be adequately monitored.             [facility] is not responsible for any belongings or valuables.  Do NOT Smoke (Tobacco/Vaping)  24 hours prior to your procedure  If you use a CPAP at night, you may bring your mask for your overnight stay.   Contacts, glasses, hearing aids, dentures or partials may not be worn into surgery, please bring cases for these belongings   For patients admitted to the hospital, discharge time will be determined by your treatment team.   Patients discharged the day of surgery will not be allowed to drive home, and someone needs to stay with them for 24 hours.  NO VISITORS WILL  BE ALLOWED IN PRE-OP WHERE PATIENTS ARE PREPPED FOR SURGERY.  ONLY 1 SUPPORT PERSON MAY BE PRESENT IN THE WAITING ROOM WHILE YOU ARE IN SURGERY.  IF YOU ARE TO BE ADMITTED, ONCE YOU ARE IN YOUR ROOM YOU WILL BE ALLOWED TWO (2) VISITORS. 1 (ONE) VISITOR MAY STAY OVERNIGHT BUT MUST ARRIVE TO THE ROOM BY 8pm.  Minor children may have two parents present. Special consideration for safety and communication needs will be reviewed on a case by case basis.  Special instructions:    Oral Hygiene is also important to reduce your risk of infection.  Remember - BRUSH YOUR TEETH THE MORNING OF SURGERY WITH YOUR REGULAR  TOOTHPASTE   - Preparing For Surgery  Before surgery, you can play an important role. Because skin is not sterile, your skin needs to be as free of germs as possible. You can reduce the number of germs on your skin by washing with CHG (chlorahexidine gluconate) Soap before surgery.  CHG is an antiseptic cleaner which kills germs and bonds with the skin to continue killing germs even after washing.     Please do not use if you have an allergy to CHG or antibacterial soaps. If your skin becomes reddened/irritated stop using the CHG.  Do not shave (including legs and underarms) for at least 48 hours prior to first CHG shower. It is OK to shave your face.  Please follow these instructions carefully.     Shower the NIGHT BEFORE SURGERY and the MORNING OF SURGERY with CHG Soap.   If you chose to wash your hair, wash your hair first as usual with your normal shampoo. After you shampoo, rinse your hair and body thoroughly to remove the shampoo.  Then [employer] and genitals (private parts) with your normal soap and rinse thoroughly to remove soap.  After that Use CHG Soap as you would any other liquid soap. You can apply CHG directly to the skin and wash gently with a scrungie or a clean washcloth.   Apply the CHG Soap to your body ONLY FROM THE NECK DOWN.  Do not use on open wounds or open sores. Avoid contact with your eyes, ears, mouth and genitals (private parts). Wash Face and genitals (private parts)  with your normal soap.   Wash thoroughly, paying special attention to the area where your surgery will be performed.  Thoroughly rinse your body with warm water from the neck down.  DO NOT shower/wash with your normal soap after using and rinsing off the CHG Soap.  Pat yourself dry with a CLEAN TOWEL.  Wear CLEAN PAJAMAS to bed the night before surgery  Place CLEAN SHEETS on your bed the night before your surgery  DO NOT SLEEP WITH PETS.   Day of Surgery: Take a shower with  CHG soap. Wear Clean/Comfortable clothing the morning of surgery Do not apply any deodorants/lotions.   Remember to brush your teeth WITH YOUR REGULAR TOOTHPASTE.   Please read over the following fact sheets that you were given.

## 2021-08-27 ENCOUNTER — Encounter (HOSPITAL_COMMUNITY)
Admission: RE | Admit: 2021-08-27 | Discharge: 2021-08-27 | Disposition: A | Discharge status: Home / Self Care | Payer: No Typology Code available for payment source | Source: Ambulatory Visit | Attending: Orthopaedic Surgery | Admitting: Orthopaedic Surgery

## 2021-08-27 ENCOUNTER — Other Ambulatory Visit: Payer: Self-pay

## 2021-08-27 ENCOUNTER — Telehealth: Payer: Self-pay | Admitting: Orthopaedic Surgery

## 2021-08-27 ENCOUNTER — Encounter (HOSPITAL_COMMUNITY): Payer: Self-pay

## 2021-08-27 DIAGNOSIS — M879 Osteonecrosis, unspecified: Secondary | ICD-10-CM | POA: Insufficient documentation

## 2021-08-27 DIAGNOSIS — M87052 Idiopathic aseptic necrosis of left femur: Secondary | ICD-10-CM

## 2021-08-27 DIAGNOSIS — Z01812 Encounter for preprocedural laboratory examination: Secondary | ICD-10-CM | POA: Insufficient documentation

## 2021-08-27 LAB — URINALYSIS, ROUTINE W REFLEX MICROSCOPIC
Bilirubin Urine: NEGATIVE
Glucose, UA: NEGATIVE mg/dL
Hgb urine dipstick: NEGATIVE
Ketones, ur: NEGATIVE mg/dL
Leukocytes,Ua: NEGATIVE
Nitrite: NEGATIVE
Protein, ur: NEGATIVE mg/dL
Specific Gravity, Urine: 1.017 (ref 1.005–1.030)
pH: 5 (ref 5.0–8.0)

## 2021-08-27 LAB — CBC WITH DIFFERENTIAL/PLATELET
Abs Immature Granulocytes: 0.01 10*3/uL (ref 0.00–0.07)
Basophils Absolute: 0.1 10*3/uL (ref 0.0–0.1)
Basophils Relative: 1 %
Eosinophils Absolute: 0.2 10*3/uL (ref 0.0–0.5)
Eosinophils Relative: 5 %
HCT: 44.7 % (ref 39.0–52.0)
Hemoglobin: 14.7 g/dL (ref 13.0–17.0)
Immature Granulocytes: 0 %
Lymphocytes Relative: 19 %
Lymphs Abs: 0.8 10*3/uL (ref 0.7–4.0)
MCH: 29.2 pg (ref 26.0–34.0)
MCHC: 32.9 g/dL (ref 30.0–36.0)
MCV: 88.7 fL (ref 80.0–100.0)
Monocytes Absolute: 0.5 10*3/uL (ref 0.1–1.0)
Monocytes Relative: 12 %
Neutro Abs: 2.7 10*3/uL (ref 1.7–7.7)
Neutrophils Relative %: 63 %
Platelets: 254 10*3/uL (ref 150–400)
RBC: 5.04 MIL/uL (ref 4.22–5.81)
RDW: 14.3 % (ref 11.5–15.5)
WBC: 4.2 10*3/uL (ref 4.0–10.5)
nRBC: 0 % (ref 0.0–0.2)

## 2021-08-27 LAB — COMPREHENSIVE METABOLIC PANEL
ALT: 25 U/L (ref 0–44)
AST: 37 U/L (ref 15–41)
Albumin: 3.5 g/dL (ref 3.5–5.0)
Alkaline Phosphatase: 70 U/L (ref 38–126)
Anion gap: 6 (ref 5–15)
BUN: 12 mg/dL (ref 6–20)
CO2: 29 mmol/L (ref 22–32)
Calcium: 9.2 mg/dL (ref 8.9–10.3)
Chloride: 104 mmol/L (ref 98–111)
Creatinine, Ser: 0.93 mg/dL (ref 0.61–1.24)
GFR, Estimated: >60 mL/min (ref >60)
Glucose, Bld: 106 mg/dL — ABNORMAL HIGH (ref 70–99)
Potassium: 3.9 mmol/L (ref 3.5–5.1)
Sodium: 139 mmol/L (ref 135–145)
Total Bilirubin: 0.6 mg/dL (ref 0.3–1.2)
Total Protein: 6.7 g/dL (ref 6.5–8.1)

## 2021-08-27 LAB — PROTIME-INR
INR: 0.9 (ref 0.8–1.2)
Prothrombin Time: 12.6 seconds (ref 11.4–15.2)

## 2021-08-27 LAB — TYPE AND SCREEN
ABO/RH(D): B POS
Antibody Screen: NEGATIVE

## 2021-08-27 LAB — APTT: aPTT: 32 seconds (ref 24–36)

## 2021-08-27 LAB — SURGICAL PCR SCREEN
MRSA, PCR: NEGATIVE
Staphylococcus aureus: NEGATIVE

## 2021-08-27 NOTE — Telephone Encounter (Signed)
Pt called and was wondering what he was is suppose to do for this week. He needs to know something about getting a note. He would like to know before 5   CB [phone]

## 2021-08-27 NOTE — Final Progress Note (Signed)
PCP: denies.  Uses urgent care if sick Cardiologist: denies  EKG:na CXR: na ECHO: denies Stress Test:denies Cardiac Cath: denies  Fasting Blood Sugar-na Checks Blood Sugar_na__ times a day  OSA/CPAP: No  Blood Thinner/ASA: No  Covid test scheduled 08/31/21 at 0930  Anesthesia Review: No  Patient denies shortness of breath, fever, cough, and chest pain at PAT appointment.  Patient verbalized understanding of instructions provided today at the PAT appointment.  Patient asked to review instructions at home and day of surgery.

## 2021-08-28 ENCOUNTER — Telehealth: Payer: Self-pay | Admitting: Orthopaedic Surgery

## 2021-08-28 NOTE — Telephone Encounter (Signed)
Called patient advised him msg already sent to Dr Roda Shutters. Pending response. I will call him as soon as he responds back. Patient aware.

## 2021-08-28 NOTE — Telephone Encounter (Signed)
That's fine

## 2021-08-28 NOTE — Telephone Encounter (Signed)
Left voice mail

## 2021-08-28 NOTE — Telephone Encounter (Signed)
Pt called requesting for a work note. Pt is asking for it to be ready before the end of the day. Please call pt about this matter at [phone].

## 2021-08-28 NOTE — Telephone Encounter (Signed)
Patient called advised he is returning Dr Warren Danes call. Patient said he will also need an out of work note for his employer.  The note need to state how long he will be out of work.  Patient said he need to get the note today. Patient asked if he need to remain out of work until his surgery date? The number to contact patient is [phone]

## 2021-08-29 ENCOUNTER — Other Ambulatory Visit: Payer: Self-pay | Admitting: Physician Assistant

## 2021-08-29 MED ORDER — METHOCARBAMOL 500 MG PO TABS: 500.0000 mg | ORAL_TABLET | Freq: Two times a day (BID) | ORAL | 2 refills | Status: DC | PRN

## 2021-08-29 MED ORDER — DOCUSATE SODIUM 100 MG PO CAPS: 100.0000 mg | ORAL_CAPSULE | Freq: Every day | ORAL | 2 refills | Status: AC | PRN

## 2021-08-29 MED ORDER — ASPIRIN EC 81 MG PO TBEC: 81.0000 mg | DELAYED_RELEASE_TABLET | Freq: Two times a day (BID) | ORAL | 0 refills | Status: DC

## 2021-08-29 MED ORDER — ONDANSETRON HCL 4 MG PO TABS: 4.0000 mg | ORAL_TABLET | Freq: Three times a day (TID) | ORAL | 0 refills | Status: DC | PRN

## 2021-08-29 MED ORDER — OXYCODONE-ACETAMINOPHEN 5-325 MG PO TABS: 1.0000 | ORAL_TABLET | Freq: Four times a day (QID) | ORAL | 0 refills | Status: DC | PRN

## 2021-08-29 NOTE — Telephone Encounter (Signed)
Note Ready for pick up

## 2021-08-29 NOTE — Telephone Encounter (Signed)
Now until 3 months after surgery

## 2021-08-29 NOTE — Telephone Encounter (Signed)
How long?

## 2021-08-31 ENCOUNTER — Other Ambulatory Visit (HOSPITAL_COMMUNITY)
Admit: 2021-08-31 | Discharge: 2021-08-31 | Disposition: A | Discharge status: Home / Self Care | Payer: Self-pay | Attending: Orthopaedic Surgery | Admitting: Orthopaedic Surgery

## 2021-08-31 DIAGNOSIS — Z01812 Encounter for preprocedural laboratory examination: Secondary | ICD-10-CM | POA: Insufficient documentation

## 2021-08-31 DIAGNOSIS — Z20822 Contact with and (suspected) exposure to covid-19: Secondary | ICD-10-CM | POA: Insufficient documentation

## 2021-08-31 DIAGNOSIS — Z01818 Encounter for other preprocedural examination: Secondary | ICD-10-CM

## 2021-08-31 LAB — SARS CORONAVIRUS 2 (TAT 6-24 HRS): SARS Coronavirus 2: NEGATIVE

## 2021-08-31 MED ORDER — TRANEXAMIC ACID 1000 MG/10ML IV SOLN
2000.0000 mg | INTRAVENOUS | Status: DC
  Filled 2021-08-31: qty 20

## 2021-09-03 ENCOUNTER — Ambulatory Visit (HOSPITAL_COMMUNITY): Payer: Self-pay

## 2021-09-03 ENCOUNTER — Observation Stay (HOSPITAL_COMMUNITY)
Admission: RE | Admit: 2021-09-03 | Discharge: 2021-09-04 | Disposition: A | Discharge status: Home / Self Care | Payer: Self-pay | Source: Ambulatory Visit | Attending: Orthopaedic Surgery | Admitting: Orthopaedic Surgery

## 2021-09-03 ENCOUNTER — Observation Stay (HOSPITAL_COMMUNITY): Payer: Self-pay

## 2021-09-03 ENCOUNTER — Other Ambulatory Visit: Payer: Self-pay

## 2021-09-03 ENCOUNTER — Ambulatory Visit (HOSPITAL_COMMUNITY): Payer: Self-pay | Admitting: Anesthesiology

## 2021-09-03 ENCOUNTER — Encounter (HOSPITAL_COMMUNITY)
Admission: RE | Disposition: A | Discharge status: Home / Self Care | Payer: Self-pay | Source: Ambulatory Visit | Attending: Orthopaedic Surgery

## 2021-09-03 ENCOUNTER — Encounter (HOSPITAL_COMMUNITY): Payer: Self-pay | Admitting: Orthopaedic Surgery

## 2021-09-03 DIAGNOSIS — Z96649 Presence of unspecified artificial hip joint: Secondary | ICD-10-CM

## 2021-09-03 DIAGNOSIS — Z419 Encounter for procedure for purposes other than remedying health state, unspecified: Secondary | ICD-10-CM

## 2021-09-03 DIAGNOSIS — Z96642 Presence of left artificial hip joint: Secondary | ICD-10-CM

## 2021-09-03 DIAGNOSIS — F17210 Nicotine dependence, cigarettes, uncomplicated: Secondary | ICD-10-CM | POA: Insufficient documentation

## 2021-09-03 DIAGNOSIS — Z7982 Long term (current) use of aspirin: Secondary | ICD-10-CM | POA: Insufficient documentation

## 2021-09-03 DIAGNOSIS — Z79899 Other long term (current) drug therapy: Secondary | ICD-10-CM | POA: Insufficient documentation

## 2021-09-03 DIAGNOSIS — M87052 Idiopathic aseptic necrosis of left femur: Principal | ICD-10-CM | POA: Diagnosis present

## 2021-09-03 HISTORY — PX: TOTAL HIP ARTHROPLASTY: SHX124

## 2021-09-03 LAB — ABO/RH: ABO/RH(D): B POS

## 2021-09-03 SURGERY — ARTHROPLASTY, HIP, TOTAL, ANTERIOR APPROACH
Anesthesia: General | Site: Hip | Laterality: Left

## 2021-09-03 MED ORDER — SORBITOL 70 % SOLN: 30.0000 mL | Freq: Every day | Status: DC | PRN

## 2021-09-03 MED ORDER — MIDAZOLAM HCL 2 MG/2ML IJ SOLN
INTRAMUSCULAR | Status: DC | PRN
  Administered 2021-09-03: 2 mg via INTRAVENOUS

## 2021-09-03 MED ORDER — DIPHENHYDRAMINE HCL 25 MG PO CAPS: 25.0000 mg | ORAL_CAPSULE | ORAL | Status: DC | PRN

## 2021-09-03 MED ORDER — MENTHOL 3 MG MT LOZG: 1.0000 | LOZENGE | OROMUCOSAL | Status: DC | PRN

## 2021-09-03 MED ORDER — LACTATED RINGERS IV SOLN: INTRAVENOUS | Status: DC

## 2021-09-03 MED ORDER — LIDOCAINE 2% (20 MG/ML) 5 ML SYRINGE
INTRAMUSCULAR | Status: AC
  Filled 2021-09-03: qty 5

## 2021-09-03 MED ORDER — ONDANSETRON HCL 4 MG/2ML IJ SOLN
INTRAMUSCULAR | Status: DC | PRN
  Administered 2021-09-03: 4 mg via INTRAVENOUS

## 2021-09-03 MED ORDER — SUGAMMADEX SODIUM 200 MG/2ML IV SOLN
INTRAVENOUS | Status: DC | PRN
  Administered 2021-09-03: 200 mg via INTRAVENOUS

## 2021-09-03 MED ORDER — POLYETHYLENE GLYCOL 3350 17 G PO PACK
17.0000 g | PACK | Freq: Every day | ORAL | Status: DC
  Administered 2021-09-03 – 2021-09-04 (×2): 17 g via ORAL
  Filled 2021-09-03 (×2): qty 1

## 2021-09-03 MED ORDER — LIDOCAINE 2% (20 MG/ML) 5 ML SYRINGE
INTRAMUSCULAR | Status: DC | PRN
  Administered 2021-09-03: 80 mg via INTRAVENOUS

## 2021-09-03 MED ORDER — ONDANSETRON HCL 4 MG/2ML IJ SOLN: 4.0000 mg | Freq: Four times a day (QID) | INTRAMUSCULAR | Status: DC | PRN

## 2021-09-03 MED ORDER — OXYCODONE HCL 5 MG PO TABS
5.0000 mg | ORAL_TABLET | ORAL | Status: DC | PRN
  Administered 2021-09-03 – 2021-09-04 (×2): 10 mg via ORAL
  Filled 2021-09-03: qty 2

## 2021-09-03 MED ORDER — CEFAZOLIN SODIUM-DEXTROSE 2-4 GM/100ML-% IV SOLN
2.0000 g | INTRAVENOUS | Status: AC
  Administered 2021-09-03: 10:00:00 2 g via INTRAVENOUS
  Filled 2021-09-03: qty 100

## 2021-09-03 MED ORDER — TRANEXAMIC ACID-NACL 1000-0.7 MG/100ML-% IV SOLN
1000.0000 mg | INTRAVENOUS | Status: AC
  Administered 2021-09-03: 10:00:00 1000 mg via INTRAVENOUS
  Filled 2021-09-03: qty 100

## 2021-09-03 MED ORDER — OXYCODONE HCL 5 MG PO TABS: 5.0000 mg | ORAL_TABLET | Freq: Once | ORAL | Status: DC | PRN

## 2021-09-03 MED ORDER — 0.9 % SODIUM CHLORIDE (POUR BTL) OPTIME
TOPICAL | Status: DC | PRN
  Administered 2021-09-03: 10:00:00 1000 mL

## 2021-09-03 MED ORDER — METOCLOPRAMIDE HCL 5 MG PO TABS: 5.0000 mg | ORAL_TABLET | Freq: Three times a day (TID) | ORAL | Status: DC | PRN

## 2021-09-03 MED ORDER — ALUM & MAG HYDROXIDE-SIMETH 200-200-20 MG/5ML PO SUSP: 30.0000 mL | ORAL | Status: DC | PRN

## 2021-09-03 MED ORDER — TRANEXAMIC ACID-NACL 1000-0.7 MG/100ML-% IV SOLN
1000.0000 mg | Freq: Once | INTRAVENOUS | Status: AC
  Administered 2021-09-03: 14:00:00 1000 mg via INTRAVENOUS
  Filled 2021-09-03: qty 100

## 2021-09-03 MED ORDER — ONDANSETRON HCL 4 MG PO TABS: 4.0000 mg | ORAL_TABLET | Freq: Four times a day (QID) | ORAL | Status: DC | PRN

## 2021-09-03 MED ORDER — FENTANYL CITRATE (PF) 250 MCG/5ML IJ SOLN
INTRAMUSCULAR | Status: AC
  Filled 2021-09-03: qty 5

## 2021-09-03 MED ORDER — OXYCODONE HCL ER 10 MG PO T12A
10.0000 mg | EXTENDED_RELEASE_TABLET | Freq: Two times a day (BID) | ORAL | Status: DC
  Administered 2021-09-03 – 2021-09-04 (×3): 10 mg via ORAL
  Filled 2021-09-03 (×3): qty 1

## 2021-09-03 MED ORDER — CEFAZOLIN SODIUM-DEXTROSE 2-4 GM/100ML-% IV SOLN
2.0000 g | Freq: Four times a day (QID) | INTRAVENOUS | Status: AC
  Administered 2021-09-03 (×2): 2 g via INTRAVENOUS
  Filled 2021-09-03 (×2): qty 100

## 2021-09-03 MED ORDER — DEXAMETHASONE SODIUM PHOSPHATE 10 MG/ML IJ SOLN
INTRAMUSCULAR | Status: DC | PRN
  Administered 2021-09-03: 4 mg via INTRAVENOUS

## 2021-09-03 MED ORDER — HYDROMORPHONE HCL 1 MG/ML IJ SOLN
INTRAMUSCULAR | Status: AC
  Administered 2021-09-03: 12:00:00 0.25 mg via INTRAVENOUS
  Filled 2021-09-03: qty 1

## 2021-09-03 MED ORDER — IRRISEPT - 450ML BOTTLE WITH 0.05% CHG IN STERILE WATER, USP 99.95% OPTIME
TOPICAL | Status: DC | PRN
  Administered 2021-09-03: 10:00:00 450 mL

## 2021-09-03 MED ORDER — ROCURONIUM BROMIDE 10 MG/ML (PF) SYRINGE
PREFILLED_SYRINGE | INTRAVENOUS | Status: AC
  Filled 2021-09-03: qty 10

## 2021-09-03 MED ORDER — METHOCARBAMOL 1000 MG/10ML IJ SOLN
500.0000 mg | Freq: Four times a day (QID) | INTRAVENOUS | Status: DC | PRN
  Filled 2021-09-03: qty 5

## 2021-09-03 MED ORDER — ACETAMINOPHEN 500 MG PO TABS
ORAL_TABLET | ORAL | Status: AC
  Filled 2021-09-03: qty 1

## 2021-09-03 MED ORDER — CHLORHEXIDINE GLUCONATE 0.12 % MT SOLN
OROMUCOSAL | Status: AC
  Administered 2021-09-03: 08:00:00 15 mL via OROMUCOSAL
  Filled 2021-09-03: qty 15

## 2021-09-03 MED ORDER — POVIDONE-IODINE 10 % EX SWAB
2.0000 "application " | Freq: Once | CUTANEOUS | Status: AC
  Administered 2021-09-03: 2 via TOPICAL

## 2021-09-03 MED ORDER — PROMETHAZINE HCL 25 MG/ML IJ SOLN: 6.2500 mg | INTRAMUSCULAR | Status: DC | PRN

## 2021-09-03 MED ORDER — DROPERIDOL 2.5 MG/ML IJ SOLN: 0.6250 mg | Freq: Once | INTRAMUSCULAR | Status: DC | PRN

## 2021-09-03 MED ORDER — BUPIVACAINE-MELOXICAM ER 400-12 MG/14ML IJ SOLN
INTRAMUSCULAR | Status: AC
  Filled 2021-09-03: qty 1

## 2021-09-03 MED ORDER — ACETAMINOPHEN 500 MG PO TABS
1000.0000 mg | ORAL_TABLET | Freq: Four times a day (QID) | ORAL | Status: AC
  Administered 2021-09-03 – 2021-09-04 (×4): 1000 mg via ORAL
  Filled 2021-09-03 (×4): qty 2

## 2021-09-03 MED ORDER — METHOCARBAMOL 500 MG PO TABS
500.0000 mg | ORAL_TABLET | Freq: Four times a day (QID) | ORAL | Status: DC | PRN
  Administered 2021-09-03 – 2021-09-04 (×2): 500 mg via ORAL
  Filled 2021-09-03 (×2): qty 1

## 2021-09-03 MED ORDER — FENTANYL CITRATE (PF) 100 MCG/2ML IJ SOLN: 25.0000 ug | INTRAMUSCULAR | Status: DC | PRN

## 2021-09-03 MED ORDER — PROPOFOL 10 MG/ML IV BOLUS
INTRAVENOUS | Status: AC
  Filled 2021-09-03: qty 20

## 2021-09-03 MED ORDER — PANTOPRAZOLE SODIUM 40 MG PO TBEC
40.0000 mg | DELAYED_RELEASE_TABLET | Freq: Every day | ORAL | Status: DC
  Administered 2021-09-03 – 2021-09-04 (×2): 40 mg via ORAL
  Filled 2021-09-03 (×2): qty 1

## 2021-09-03 MED ORDER — CHLORHEXIDINE GLUCONATE 0.12 % MT SOLN
15.0000 mL | Freq: Once | OROMUCOSAL | Status: AC
  Filled 2021-09-03: qty 15

## 2021-09-03 MED ORDER — SODIUM CHLORIDE 0.9 % IV SOLN: INTRAVENOUS | Status: DC

## 2021-09-03 MED ORDER — TRANEXAMIC ACID 1000 MG/10ML IV SOLN
INTRAVENOUS | Status: DC | PRN
  Administered 2021-09-03: 10:00:00 2000 mg via TOPICAL

## 2021-09-03 MED ORDER — ACETAMINOPHEN 500 MG PO TABS
1000.0000 mg | ORAL_TABLET | Freq: Once | ORAL | Status: AC
  Administered 2021-09-03: 08:00:00 1000 mg via ORAL
  Filled 2021-09-03: qty 2

## 2021-09-03 MED ORDER — ACETAMINOPHEN 325 MG PO TABS: 325.0000 mg | ORAL_TABLET | Freq: Four times a day (QID) | ORAL | Status: DC | PRN

## 2021-09-03 MED ORDER — SODIUM CHLORIDE 0.9 % IR SOLN
Status: DC | PRN
  Administered 2021-09-03: 1000 mL

## 2021-09-03 MED ORDER — ASPIRIN 81 MG PO CHEW
81.0000 mg | CHEWABLE_TABLET | Freq: Two times a day (BID) | ORAL | Status: DC
  Administered 2021-09-03 – 2021-09-04 (×2): 81 mg via ORAL
  Filled 2021-09-03 (×2): qty 1

## 2021-09-03 MED ORDER — OXYCODONE HCL 5 MG/5ML PO SOLN: 5.0000 mg | Freq: Once | ORAL | Status: DC | PRN

## 2021-09-03 MED ORDER — OXYCODONE HCL 5 MG PO TABS
10.0000 mg | ORAL_TABLET | ORAL | Status: DC | PRN
  Filled 2021-09-03: qty 2

## 2021-09-03 MED ORDER — MIDAZOLAM HCL 2 MG/2ML IJ SOLN
INTRAMUSCULAR | Status: AC
  Filled 2021-09-03: qty 2

## 2021-09-03 MED ORDER — HYDROMORPHONE HCL 1 MG/ML IJ SOLN
0.2500 mg | INTRAMUSCULAR | Status: DC | PRN
  Administered 2021-09-03: 12:00:00 0.5 mg via INTRAVENOUS

## 2021-09-03 MED ORDER — PHENOL 1.4 % MT LIQD: 1.0000 | OROMUCOSAL | Status: DC | PRN

## 2021-09-03 MED ORDER — PROPOFOL 10 MG/ML IV BOLUS
INTRAVENOUS | Status: DC | PRN
  Administered 2021-09-03: 200 mg via INTRAVENOUS

## 2021-09-03 MED ORDER — BUPIVACAINE-MELOXICAM ER 400-12 MG/14ML IJ SOLN
INTRAMUSCULAR | Status: DC | PRN
  Administered 2021-09-03: 400 mg

## 2021-09-03 MED ORDER — VANCOMYCIN HCL 1000 MG IV SOLR
INTRAVENOUS | Status: AC
  Filled 2021-09-03: qty 20

## 2021-09-03 MED ORDER — ONDANSETRON HCL 4 MG/2ML IJ SOLN
INTRAMUSCULAR | Status: AC
  Filled 2021-09-03: qty 2

## 2021-09-03 MED ORDER — ROCURONIUM BROMIDE 10 MG/ML (PF) SYRINGE
PREFILLED_SYRINGE | INTRAVENOUS | Status: DC | PRN
  Administered 2021-09-03: 20 mg via INTRAVENOUS
  Administered 2021-09-03: 80 mg via INTRAVENOUS

## 2021-09-03 MED ORDER — VANCOMYCIN HCL 1 G IV SOLR
INTRAVENOUS | Status: DC | PRN
  Administered 2021-09-03: 1000 mg via TOPICAL

## 2021-09-03 MED ORDER — FENTANYL CITRATE (PF) 100 MCG/2ML IJ SOLN
INTRAMUSCULAR | Status: DC | PRN
  Administered 2021-09-03: 100 ug via INTRAVENOUS
  Administered 2021-09-03: 150 ug via INTRAVENOUS

## 2021-09-03 MED ORDER — METOCLOPRAMIDE HCL 5 MG/ML IJ SOLN: 5.0000 mg | Freq: Three times a day (TID) | INTRAMUSCULAR | Status: DC | PRN

## 2021-09-03 MED ORDER — DEXAMETHASONE SODIUM PHOSPHATE 10 MG/ML IJ SOLN
10.0000 mg | Freq: Once | INTRAMUSCULAR | Status: AC
  Administered 2021-09-04: 10 mg via INTRAVENOUS
  Filled 2021-09-03: qty 1

## 2021-09-03 MED ORDER — DEXAMETHASONE SODIUM PHOSPHATE 10 MG/ML IJ SOLN
INTRAMUSCULAR | Status: AC
  Filled 2021-09-03: qty 1

## 2021-09-03 MED ORDER — HYDROMORPHONE HCL 1 MG/ML IJ SOLN: 0.5000 mg | INTRAMUSCULAR | Status: DC | PRN

## 2021-09-03 MED ORDER — DOCUSATE SODIUM 100 MG PO CAPS
100.0000 mg | ORAL_CAPSULE | Freq: Two times a day (BID) | ORAL | Status: DC
  Administered 2021-09-03 – 2021-09-04 (×2): 100 mg via ORAL
  Filled 2021-09-03 (×2): qty 1

## 2021-09-03 SURGICAL SUPPLY — 68 items
ADH SKN CLS APL DERMABOND .7 (GAUZE/BANDAGES/DRESSINGS) ×1
BAG COUNTER SPONGE SURGICOUNT (BAG) ×2 IMPLANT
BAG DECANTER FOR FLEXI CONT (MISCELLANEOUS) ×2 IMPLANT
BAG SPNG CNTER NS LX DISP (BAG) ×1
BALL HIP CERAMIC 32MM PLUS 9 ×1 IMPLANT
CELLS DAT CNTRL 66122 CELL SVR (MISCELLANEOUS) IMPLANT
COOLER ICEMAN CLASSIC (MISCELLANEOUS) ×2 IMPLANT
COVER PERINEAL POST (MISCELLANEOUS) ×2 IMPLANT
COVER SURGICAL LIGHT HANDLE (MISCELLANEOUS) ×2 IMPLANT
CUP SECTOR GRIPTON 50MM (Cup) ×2 IMPLANT
DERMABOND ADVANCED (GAUZE/BANDAGES/DRESSINGS) ×1
DERMABOND ADVANCED .7 DNX12 (GAUZE/BANDAGES/DRESSINGS) ×1 IMPLANT
DRAPE C-ARM 42X72 X-RAY (DRAPES) ×2 IMPLANT
DRAPE POUCH INSTRU U-SHP 10X18 (DRAPES) ×2 IMPLANT
DRAPE STERI IOBAN 125X83 (DRAPES) ×2 IMPLANT
DRAPE U-SHAPE 47X51 STRL (DRAPES) ×4 IMPLANT
DRESSING AQUACEL AG SP 3.5X10 (GAUZE/BANDAGES/DRESSINGS) ×1 IMPLANT
DRSG AQUACEL AG ADV 3.5X10 (GAUZE/BANDAGES/DRESSINGS) IMPLANT
DRSG AQUACEL AG SP 3.5X10 (GAUZE/BANDAGES/DRESSINGS) ×2
DURAPREP 26ML APPLICATOR (WOUND CARE) ×4 IMPLANT
ELECT BLADE 4.0 EZ CLEAN MEGAD (MISCELLANEOUS) ×2
ELECT REM PT RETURN 9FT ADLT (ELECTROSURGICAL) ×2
ELECTRODE BLDE 4.0 EZ CLN MEGD (MISCELLANEOUS) ×1 IMPLANT
ELECTRODE REM PT RTRN 9FT ADLT (ELECTROSURGICAL) ×1 IMPLANT
GLOVE SURG LTX SZ7 (GLOVE) ×4 IMPLANT
GLOVE SURG SYN 7.5  E (GLOVE) ×8
GLOVE SURG SYN 7.5 E (GLOVE) ×4 IMPLANT
GLOVE SURG UNDER POLY LF SZ7 (GLOVE) ×10 IMPLANT
GLOVE SURG UNDER POLY LF SZ7.5 (GLOVE) ×4 IMPLANT
GOWN STRL REIN XL XLG (GOWN DISPOSABLE) ×2 IMPLANT
GOWN STRL REUS W/ TWL LRG LVL3 (GOWN DISPOSABLE) IMPLANT
GOWN STRL REUS W/ TWL XL LVL3 (GOWN DISPOSABLE) ×1 IMPLANT
GOWN STRL REUS W/TWL LRG LVL3 (GOWN DISPOSABLE)
GOWN STRL REUS W/TWL XL LVL3 (GOWN DISPOSABLE) ×2
HANDPIECE INTERPULSE COAX TIP (DISPOSABLE) ×2
HIP BALL CERAMIC 32MM PLUS 9 ×2 IMPLANT
HOOD PEEL AWAY FLYTE STAYCOOL (MISCELLANEOUS) ×6 IMPLANT
IV NS IRRIG 3000ML ARTHROMATIC (IV SOLUTION) IMPLANT
JET LAVAGE IRRISEPT WOUND (IRRIGATION / IRRIGATOR) ×2
KIT BASIN OR (CUSTOM PROCEDURE TRAY) ×2 IMPLANT
LAVAGE JET IRRISEPT WOUND (IRRIGATION / IRRIGATOR) ×1 IMPLANT
LINER ACET PNNCL PLUS4 NEUTRAL (Hips) ×1 IMPLANT
MARKER SKIN DUAL TIP RULER LAB (MISCELLANEOUS) ×2 IMPLANT
NEEDLE SPNL 18GX3.5 QUINCKE PK (NEEDLE) ×2 IMPLANT
PACK TOTAL JOINT (CUSTOM PROCEDURE TRAY) ×2 IMPLANT
PACK UNIVERSAL I (CUSTOM PROCEDURE TRAY) ×2 IMPLANT
PAD COLD SHLDR WRAP-ON (PAD) ×2 IMPLANT
PINNACLE PLUS 4 NEUTRAL (Hips) ×2 IMPLANT
RTRCTR WOUND ALEXIS 18CM MED (MISCELLANEOUS)
SAW OSC TIP CART 19.5X105X1.3 (SAW) ×2 IMPLANT
SET HNDPC FAN SPRY TIP SCT (DISPOSABLE) ×1 IMPLANT
SPONGE T-LAP 18X18 ~~LOC~~+RFID (SPONGE) ×4 IMPLANT
STAPLER VISISTAT 35W (STAPLE) IMPLANT
STEM FEM ACTIS STD SZ4 (Stem) ×2 IMPLANT
SUT ETHIBOND 2 V 37 (SUTURE) ×2 IMPLANT
SUT ETHILON 2 0 FS 18 (SUTURE) ×6 IMPLANT
SUT VIC AB 0 CT1 27 (SUTURE) ×2
SUT VIC AB 0 CT1 27XBRD ANBCTR (SUTURE) ×1 IMPLANT
SUT VIC AB 1 CTX 36 (SUTURE) ×2
SUT VIC AB 1 CTX36XBRD ANBCTR (SUTURE) ×1 IMPLANT
SUT VIC AB 2-0 CT1 27 (SUTURE) ×4
SUT VIC AB 2-0 CT1 TAPERPNT 27 (SUTURE) ×2 IMPLANT
SYR 50ML LL SCALE MARK (SYRINGE) ×2 IMPLANT
TOWEL GREEN STERILE (TOWEL DISPOSABLE) ×2 IMPLANT
TRAY CATH 16FR W/PLASTIC CATH (SET/KITS/TRAYS/PACK) IMPLANT
TRAY FOLEY W/BAG SLVR 16FR (SET/KITS/TRAYS/PACK)
TRAY FOLEY W/BAG SLVR 16FR ST (SET/KITS/TRAYS/PACK) IMPLANT
YANKAUER SUCT BULB TIP NO VENT (SUCTIONS) ×2 IMPLANT

## 2021-09-03 NOTE — Anesthesia Postprocedure Evaluation (Signed)
Anesthesia Post Note  Patient: [name]  Procedure(s) Performed: LEFT TOTAL HIP ARTHROPLASTY ANTERIOR APPROACH (Left: Hip)     Patient location during evaluation: PACU Anesthesia Type: General Level of consciousness: awake Pain management: pain level controlled Vital Signs Assessment: post-procedure vital signs reviewed and stable Respiratory status: spontaneous breathing and respiratory function stable Cardiovascular status: stable Postop Assessment: no apparent nausea or vomiting Anesthetic complications: no   No notable events documented.  Last Vitals:  Vitals:   09/03/21 1214 09/03/21 1214  BP: 115/73 115/73  Pulse: 71 74  Resp: 16 16  Temp: 36.9 C 36.9 C  SpO2: 92% 93%    Last Pain:  Vitals:   09/03/21 1214  TempSrc: Oral  PainSc:                  Mellody Dance

## 2021-09-03 NOTE — Anesthesia Procedure Notes (Signed)
Procedure Name: Intubation Date/Time: 09/03/2021 9:40 AM Performed by: Moshe Salisbury, CRNA Pre-anesthesia Checklist: Patient identified, Emergency Drugs available, Suction available and Patient being monitored Patient Re-evaluated:Patient Re-evaluated prior to induction Oxygen Delivery Method: Circle System Utilized Preoxygenation: Pre-oxygenation with 100% oxygen Induction Type: IV induction Ventilation: Mask ventilation without difficulty Laryngoscope Size: Mac and 4 Grade View: Grade I Tube type: Oral Tube size: 8.0 mm Number of attempts: 1 Airway Equipment and Method: Stylet Placement Confirmation: ETT inserted through vocal cords under direct vision, positive ETCO2 and breath sounds checked- equal and bilateral Secured at: 23 cm Tube secured with: Tape Dental Injury: Teeth and Oropharynx as per pre-operative assessment

## 2021-09-03 NOTE — Op Note (Signed)
LEFT TOTAL HIP ARTHROPLASTY ANTERIOR APPROACH  Procedure Note CHAD DONOGHUE   [id number]  Pre-op Diagnosis: left hip avasular necrosis, degenerative joint disease     Post-op Diagnosis: same   Operative Procedures  1. Total hip replacement; Left hip; uncemented [id number]   Surgeon: Gershon Mussel, M.D.  Assist: Oneal Grout, PA-C   Anesthesia: general  Prosthesis: Depuy Acetabulum: Pinnacle 50 mm Femur: Actis 4 STD Head: 32 mm size: +9 Liner: +4 Bearing Type: ceramic/poly  Total Hip Arthroplasty (Anterior Approach) Op Note:  After informed consent was obtained and the operative extremity marked in the holding area, the patient was brought back to the operating room and placed supine on the HANA table. Next, the operative extremity was prepped and draped in normal sterile fashion. Surgical timeout occurred verifying patient identification, surgical site, surgical procedure and administration of antibiotics.  A modified anterior Smith-Peterson approach to the hip was performed, using the interval between tensor fascia lata and sartorius.  Dissection was carried bluntly down onto the anterior hip capsule. The lateral femoral circumflex vessels were identified and coagulated. A capsulotomy was performed and the capsular flaps tagged for later repair.  The neck osteotomy was performed. The femoral head was removed which showed severe wear, the acetabular rim was cleared of soft tissue and attention was turned to reaming the acetabulum.  Sequential reaming was performed under fluoroscopic guidance. We reamed to a size 49 mm which gave excellent bite, and then impacted the 50 mm acetabular shell.   The liner was then placed after irrigation and attention turned to the femur.  After placing the femoral hook, the leg was taken to externally rotated, extended and adducted position taking care to perform soft tissue releases to allow for adequate mobilization of the femur. Soft tissue was  cleared from the shoulder of the greater trochanter and the hook elevator used to improve exposure of the proximal femur. Sequential broaching performed up to a size 4. Trial neck and head were placed. The leg was brought back up to neutral and the construct reduced.  Antibiotic irrigation was placed in the surgical wound and kept for at least 1 minute.  The position and sizing of components, offset and leg lengths were checked using fluoroscopy. Stability of the construct was checked in extension and external rotation without any subluxation or impingement of prosthesis. We dislocated the prosthesis, dropped the leg back into position, removed trial components, and irrigated copiously. The final stem and head was then placed, the leg brought back up, the system reduced and fluoroscopy used to verify positioning.  We irrigated, obtained hemostasis and closed the capsule using #2 ethibond suture.  One gram of vancomycin powder was placed in the surgical bed.   One gram of topical tranexamic acid was injected into the joint.  The fascia was closed with #1 vicryl plus, the deep fat layer was closed with 0 vicryl, the subcutaneous layers closed with 2.0 Vicryl Plus and the skin closed with 2.0 nylon and dermabond. A sterile dressing was applied. The patient was awakened in the operating room and taken to recovery in stable condition.  All sponge, needle, and instrument counts were correct at the end of the case.   Tessa Lerner, my PA, was a medical necessity for opening, closing, limb positioning, retracting, exposing, and overall facilitation and timely completion of the surgery.  Position: supine  Complications: see description of procedure.  Time Out: performed   Drains/Packing: none  Estimated blood loss: see anesthesia record  Returned to Recovery Room: in good condition.   Antibiotics: yes   Mechanical VTE (DVT) Prophylaxis: sequential compression devices, TED thigh-high  Chemical VTE (DVT)  Prophylaxis: aspirin   Fluid Replacement: see anesthesia record  Specimens Removed: 1 to pathology   Sponge and Instrument Count Correct? yes   PACU: portable radiograph - low AP   Plan/RTC: Return in 2 weeks for staple removal. Weight Bearing/Load Lower Extremity: full  Hip precautions: none Suture Removal: 2 weeks   N. Glee Arvin, MD [facility] 10:59 AM   Implant Name Type Inv. Item Serial No. Manufacturer Lot No. LRB No. Used Action  CUP SECTOR GRIPTON - [id number] Cup CUP SECTOR GRIPTON  [employer] [id number] Left 1 Implanted  PINNACLE PLUS 4 NEUTRAL - [id number] Hips PINNACLE PLUS 4 NEUTRAL  [employer] [id number] Left 1 Implanted  STEM FEM ACTIS STD SZ4 - [id number] Stem STEM FEM ACTIS STD SZ4  [employer] [id number] Left 1 Implanted  HIP BALL CERAMIC PLUS 9 - [id number]  HIP BALL CERAMIC PLUS 9  [employer]PU[employer]TH[employer] [id number] Left 1 Implanted

## 2021-09-03 NOTE — Evaluation (Signed)
Physical Therapy Evaluation Patient Details Name: [name] MRN: [id number] DOB: 11/14/1990 Today's Date: 09/03/2021  History of Present Illness  Pt is a 30 y/o male s/p L THA, direct anterior. PMH includes smoker and chron's.  Clinical Impression  Pt admitted secondary to problem above with deficits below. Pt requiring min guard A for mobility tasks using RW. Tolerated ambulation well. Reports he plans to go home and have his brother stay with him or he will go home with his mother. Will continue to follow acutely.        Recommendations for follow up therapy are one component of a multi-disciplinary discharge planning process, led by the attending physician.  Recommendations may be updated based on patient status, additional functional criteria and insurance authorization.  Follow Up Recommendations Follow physician's recommendations for discharge plan and follow up therapies    Assistance Recommended at Discharge Intermittent Supervision/Assistance  Functional Status Assessment Patient has had a recent decline in their functional status and demonstrates the ability to make significant improvements in function in a reasonable and predictable amount of time.  Equipment Recommendations  Rolling walker (2 wheels);BSC/3in1    Recommendations for Other Services       Precautions / Restrictions Precautions Precautions: Fall Restrictions Weight Bearing Restrictions: Yes LLE Weight Bearing: Weight bearing as tolerated      Mobility  Bed Mobility Overal bed mobility: Needs Assistance Bed Mobility: Supine to Sit     Supine to sit: Min assist     General bed mobility comments: Min A for LLE assist    Transfers Overall transfer level: Needs assistance Equipment used: Rolling walker (2 wheels) Transfers: Sit to/from Stand Sit to Stand: Min guard           General transfer comment: Min guard A for safety. Cues for hand placement.    Ambulation/Gait Ambulation/Gait  assistance: Min guard Gait Distance (Feet): 75 Feet Assistive device: Rolling walker (2 wheels) Gait Pattern/deviations: Step-to pattern;Decreased step length - right;Decreased step length - left;Decreased weight shift to left;Antalgic Gait velocity: Decreased     General Gait Details: Slow, antalgic gait. Min guard for safety. Difficulty with L hip flexion secondary to pain.  Stairs            Wheelchair Mobility    Modified Rankin (Stroke Patients Only)       Balance Overall balance assessment: Needs assistance Sitting-balance support: No upper extremity supported;Feet supported Sitting balance-Leahy Scale: Good     Standing balance support: Bilateral upper extremity supported;During functional activity Standing balance-Leahy Scale: Poor Standing balance comment: Reliant on BUE support                             Pertinent Vitals/Pain Pain Assessment: Faces Faces Pain Scale: Hurts even more Pain Location: L hip Pain Descriptors / Indicators: Grimacing;Guarding Pain Intervention(s): Limited activity within patient's tolerance;Monitored during session;Repositioned    Home Living Family/patient expects to be discharged to:: Private residence Living Arrangements: Alone Available Help at Discharge: Family;Available 24 hours/day Type of Home: Apartment Home Access: Stairs to enter Entrance Stairs-Rails: Right Entrance Stairs-Number of Steps: flight   Home Layout: One level Home Equipment: None Additional Comments: Reports he may stay with his mom who has 1 porch step and 1 level house.    Prior Function Prior Level of Function : Independent/Modified Independent                     Hand  Dominance        Extremity/Trunk Assessment   Upper Extremity Assessment Upper Extremity Assessment: Overall WFL for tasks assessed    Lower Extremity Assessment Lower Extremity Assessment: LLE deficits/detail LLE Deficits / Details: Deficits  consistent with post op pain and weakness. Difficulty with hip flexion    Cervical / Trunk Assessment Cervical / Trunk Assessment: Normal  Communication   Communication: No difficulties  Cognition Arousal/Alertness: Awake/alert Behavior During Therapy: WFL for tasks assessed/performed Overall Cognitive Status: Within Functional Limits for tasks assessed                                          General Comments      Exercises     Assessment/Plan    PT Assessment Patient needs continued PT services  PT Problem List Decreased strength;Decreased range of motion;Decreased balance;Decreased mobility;Decreased knowledge of use of DME;Pain;Decreased activity tolerance       PT Treatment Interventions DME instruction;Gait training;Stair training;Functional mobility training;Therapeutic activities;Therapeutic exercise;Balance training;Patient/family education    PT Goals (Current goals can be found in the Care Plan section)  Acute Rehab PT Goals Patient Stated Goal: to go home PT Goal Formulation: With patient Time For Goal Achievement: 09/17/21 Potential to Achieve Goals: Good    Frequency 7X/week   Barriers to discharge        Co-evaluation               AM-PAC PT "6 Clicks" Mobility  Outcome Measure Help needed turning from your back to your side while in a flat bed without using bedrails?: A Little Help needed moving from lying on your back to sitting on the side of a flat bed without using bedrails?: A Little Help needed moving to and from a bed to a chair (including a wheelchair)?: A Little Help needed standing up from a chair using your arms (e.g., wheelchair or bedside chair)?: A Little Help needed to walk in hospital room?: A Little Help needed climbing 3-5 steps with a railing? : A Little 6 Click Score: 18    End of Session Equipment Utilized During Treatment: Gait belt Activity Tolerance: Patient tolerated treatment well Patient left: in  chair;with call bell/phone within reach Nurse Communication: Mobility status PT Visit Diagnosis: Pain;Other abnormalities of gait and mobility (R26.89) Pain - Right/Left: Left Pain - part of body: Hip    Time: 5027-7412 PT Time Calculation (min) (ACUTE ONLY): 14 min   Charges:   PT Evaluation $PT Eval Low Complexity: 1 Low          Cindee Salt, DPT  Acute Rehabilitation Services  Pager: [phone] Office: [phone]   Lehman Prom 09/03/2021, 4:30 PM

## 2021-09-03 NOTE — Anesthesia Preprocedure Evaluation (Addendum)
Anesthesia Evaluation  Patient identified by MRN, date of birth, ID band Patient awake    Reviewed: Allergy & Precautions, NPO status , Patient's Chart, lab work & pertinent test results  Airway Mallampati: II  TM Distance: >3 FB Neck ROM: Full    Dental no notable dental hx.    Pulmonary neg pulmonary ROS, Current Smoker and Patient abstained from smoking.,    Pulmonary exam normal breath sounds clear to auscultation       Cardiovascular Exercise Tolerance: Good Normal cardiovascular exam Rhythm:Regular Rate:Normal     Neuro/Psych PSYCHIATRIC DISORDERS    GI/Hepatic negative GI ROS, Neg liver ROS, Crohn's   Endo/Other  negative endocrine ROS  Renal/GU negative Renal ROS     Musculoskeletal  (+) Arthritis ,   Abdominal   Peds negative pediatric ROS (+)  Hematology   Anesthesia Other Findings   Reproductive/Obstetrics                            Anesthesia Physical Anesthesia Plan  ASA: 2  Anesthesia Plan: General   Post-op Pain Management: Tylenol PO (pre-op)   Induction: Intravenous  PONV Risk Score and Plan: 0 and Treatment may vary due to age or medical condition  Airway Management Planned: Oral ETT  Additional Equipment: None  Intra-op Plan:   Post-operative Plan: Extubation in OR  Informed Consent: I have reviewed the patients History and Physical, chart, labs and discussed the procedure including the risks, benefits and alternatives for the proposed anesthesia with the patient or authorized representative who has indicated his/her understanding and acceptance.     Dental advisory given  Plan Discussed with: CRNA, Anesthesiologist and Surgeon  Anesthesia Plan Comments: (Patient VERY apprehensive re: spinal. Repeatedly stated he did not want to be able to not move from the waist down even if it was temporary and he was sedated. Discussed risks/benefits of spinal vs.  General and patient chooses general. Tanna Furry, MD  )       Anesthesia Quick Evaluation

## 2021-09-03 NOTE — H&P (Signed)
PREOPERATIVE H&P  Chief Complaint: left hip avasular necrosis  HPI: [name] is a 30 y.o. male who presents for surgical treatment of left hip avasular necrosis.  He denies any changes in medical history.  Past Medical History:  Diagnosis Date   Brief psychotic disorder (HCC)    Cerebral concussion    Crohn's    Hard of hearing    Tics of organic origin    Weight loss    Past Surgical History:  Procedure Laterality Date   KNEE SURGERY     Social History   Socioeconomic History   Marital status: Single    Spouse name: Not on file   Number of children: 1   Years of education: Not on file   Highest education level: Not on file  Occupational History   Occupation: Temp Work   Tobacco Use   Smoking status: Every Day    Types: Cigarettes, Cigars   Smokeless tobacco: Never  Vaping Use   Vaping Use: Never used  Substance and Sexual Activity   Alcohol use: Yes    Comment: occ   Drug use: Not Currently    Frequency: 5.0 times per week    Types: Marijuana   Sexual activity: Not on file  Other Topics Concern   Not on file  Social History Narrative   Not on file   Social Determinants of Health   Financial Resource Strain: Not on file  Food Insecurity: Not on file  Transportation Needs: Not on file  Physical Activity: Not on file  Stress: Not on file  Social Connections: Not on file   Family History  Problem Relation Age of Onset   Bipolar disorder Father    Alcohol abuse Father    Hypertension Mother    Prostate cancer Paternal Grandfather    Colon cancer Neg Hx    Allergies  Allergen Reactions   Ibuprofen Nausea And Vomiting    Stomach bleeding   Prior to Admission medications   Medication Sig Start Date End Date Taking? Authorizing Provider  acetaminophen (TYLENOL) 500 MG tablet Take 2 tablets (1,000 mg total) by mouth every 8 (eight) hours as needed for moderate pain. Patient not taking: Reported on 08/22/2021 06/02/20   Darr, Gerilyn Pilgrim, PA-C   aspirin EC 81 MG tablet Take 1 tablet (81 mg total) by mouth 2 (two) times daily. To be taken after surgery 08/29/21   Cristie Hem, PA-C  docusate sodium (COLACE) 100 MG capsule Take 1 capsule (100 mg total) by mouth daily as needed. 08/29/21 08/29/22  Cristie Hem, PA-C  methocarbamol (ROBAXIN) 500 MG tablet Take 1 tablet (500 mg total) by mouth 2 (two) times daily as needed. To be taken after surgery 08/29/21   Cristie Hem, PA-C  ondansetron (ZOFRAN) 4 MG tablet Take 1 tablet (4 mg total) by mouth every 8 (eight) hours as needed for nausea or vomiting. 08/29/21   Cristie Hem, PA-C  oxyCODONE-acetaminophen (PERCOCET) 5-325 MG tablet Take 1-2 tablets by mouth every 6 (six) hours as needed. To be taken after surgery 08/29/21   Cristie Hem, PA-C  predniSONE (DELTASONE) 20 MG tablet Take 2 tablets (40 mg total) by mouth daily. Patient not taking: Reported on 08/22/2021 05/22/21   Gwyneth Sprout, MD  traMADol (ULTRAM) 50 MG tablet Take 1-2 tablets (50-100 mg total) by mouth daily as needed. Patient not taking: No sig reported 07/20/21   Tarry Kos, MD     Positive ROS: All  other systems have been reviewed and were otherwise negative with the exception of those mentioned in the HPI and as above.  Physical Exam: General: Alert, no acute distress Cardiovascular: No pedal edema Respiratory: No cyanosis, no use of accessory musculature GI: abdomen soft Skin: No lesions in the area of chief complaint Neurologic: Sensation intact distally Psychiatric: Patient is competent for consent with normal mood and affect Lymphatic: no lymphedema  MUSCULOSKELETAL: exam stable  Assessment: left hip avasular necrosis  Plan: Plan for Procedure(s): LEFT TOTAL HIP ARTHROPLASTY ANTERIOR APPROACH  The risks benefits and alternatives were discussed with the patient including but not limited to the risks of nonoperative treatment, versus surgical intervention including infection,  bleeding, nerve injury,  blood clots, cardiopulmonary complications, morbidity, mortality, among others, and they were willing to proceed.   Preoperative templating of the joint replacement has been completed, documented, and submitted to the Operating Room personnel in order to optimize intra-operative equipment management.   Glee Arvin, MD 09/03/2021 8:41 AM

## 2021-09-03 NOTE — Transfer of Care (Signed)
Immediate Anesthesia Transfer of Care Note  Patient: [name]  Procedure(s) Performed: LEFT TOTAL HIP ARTHROPLASTY ANTERIOR APPROACH (Left: Hip)  Patient Location: PACU  Anesthesia Type:General  Level of Consciousness: drowsy and patient cooperative  Airway & Oxygen Therapy: Patient Spontanous Breathing and Patient connected to nasal cannula oxygen  Post-op Assessment: Report given to RN, Post -op Vital signs reviewed and stable and Patient moving all extremities  Post vital signs: Reviewed and stable  Last Vitals:  Vitals Value Taken Time  BP    Temp    Pulse 100 09/03/21 1124  Resp 20 09/03/21 1124  SpO2 95 % 09/03/21 1124  Vitals shown include unvalidated device data.  Last Pain:  Vitals:   09/03/21 0739  TempSrc:   PainSc: 7          Complications: No notable events documented.

## 2021-09-03 NOTE — Discharge Instructions (Signed)

## 2021-09-04 ENCOUNTER — Other Ambulatory Visit: Payer: Self-pay

## 2021-09-04 ENCOUNTER — Telehealth: Payer: Self-pay | Admitting: Orthopaedic Surgery

## 2021-09-04 ENCOUNTER — Telehealth: Payer: Self-pay

## 2021-09-04 ENCOUNTER — Encounter (HOSPITAL_COMMUNITY): Payer: Self-pay | Admitting: Orthopaedic Surgery

## 2021-09-04 DIAGNOSIS — M87052 Idiopathic aseptic necrosis of left femur: Secondary | ICD-10-CM

## 2021-09-04 LAB — BASIC METABOLIC PANEL
Anion gap: 6 (ref 5–15)
BUN: 8 mg/dL (ref 6–20)
CO2: 27 mmol/L (ref 22–32)
Calcium: 8.5 mg/dL — ABNORMAL LOW (ref 8.9–10.3)
Chloride: 101 mmol/L (ref 98–111)
Creatinine, Ser: 0.85 mg/dL (ref 0.61–1.24)
GFR, Estimated: >60 mL/min (ref >60)
Glucose, Bld: 118 mg/dL — ABNORMAL HIGH (ref 70–99)
Potassium: 3.8 mmol/L (ref 3.5–5.1)
Sodium: 134 mmol/L — ABNORMAL LOW (ref 135–145)

## 2021-09-04 LAB — CBC
HCT: 36.9 % — ABNORMAL LOW (ref 39.0–52.0)
Hemoglobin: 12.4 g/dL — ABNORMAL LOW (ref 13.0–17.0)
MCH: 29.3 pg (ref 26.0–34.0)
MCHC: 33.6 g/dL (ref 30.0–36.0)
MCV: 87.2 fL (ref 80.0–100.0)
Platelets: 217 10*3/uL (ref 150–400)
RBC: 4.23 MIL/uL (ref 4.22–5.81)
RDW: 13.7 % (ref 11.5–15.5)
WBC: 7.6 10*3/uL (ref 4.0–10.5)
nRBC: 0 % (ref 0.0–0.2)

## 2021-09-04 NOTE — Discharge Summary (Signed)
Patient ID: [name] MRN: [id number] DOB/AGE: [birth year] 30 y.o.  Admit date: 09/03/2021 Discharge date: 09/04/2021  Admission Diagnoses:  Principal Problem:   Avascular necrosis of bone of left hip (HCC) Active Problems:   Status post total replacement of left hip   Discharge Diagnoses:  Same  Past Medical History:  Diagnosis Date   Brief psychotic disorder (HCC)    Cerebral concussion    Crohn's    Hard of hearing    Tics of organic origin    Weight loss     Surgeries: Procedure(s): LEFT TOTAL HIP ARTHROPLASTY ANTERIOR APPROACH on 09/03/2021   Consultants:   Discharged Condition: Improved  Hospital Course: AIRIK GOODLIN is an 30 y.o. male who was admitted 09/03/2021 for operative treatment ofAvascular necrosis of bone of left hip (HCC). Patient has severe unremitting pain that affects sleep, daily activities, and work/hobbies. After pre-op clearance the patient was taken to the operating room on 09/03/2021 and underwent  Procedure(s): LEFT TOTAL HIP ARTHROPLASTY ANTERIOR APPROACH.    Patient was given perioperative antibiotics:  Anti-infectives (From admission, onward)    Start     Dose/Rate Route Frequency Ordered Stop   09/03/21 1530  ceFAZolin (ANCEF) IVPB 2g/100 mL premix        2 g 200 mL/hr over 30 Minutes Intravenous Every 6 hours 09/03/21 1157 09/03/21 2240   09/03/21 1013  vancomycin (VANCOCIN) powder  Status:  Discontinued          As needed 09/03/21 1013 09/03/21 1118   09/03/21 0730  ceFAZolin (ANCEF) IVPB 2g/100 mL premix        2 g 200 mL/hr over 30 Minutes Intravenous On call to O.R. 09/03/21 3382 09/03/21 0932        Patient was given sequential compression devices, early ambulation, and chemoprophylaxis to prevent DVT.  Patient benefited maximally from hospital stay and there were no complications.    Recent vital signs: Patient Vitals for the past 24 hrs:  BP Temp Temp src Pulse Resp SpO2  09/04/21 0750 131/75 98.2 F (36.8 C)  Oral 75 18 100 %  09/04/21 0331 109/66 98.3 F (36.8 C) Oral 68 18 99 %  09/03/21 2302 110/72 98 F (36.7 C) Oral (!) 58 18 100 %  09/03/21 1632 131/77 98.4 F (36.9 C) Oral 60 18 99 %  09/03/21 1214 115/73 98.4 F (36.9 C) Oral 74 16 93 %  09/03/21 1214 115/73 98.4 F (36.9 C) Oral 71 16 92 %  09/03/21 1155 122/77 98.7 F (37.1 C) -- 77 14 94 %  09/03/21 1140 129/81 -- -- 82 16 94 %  09/03/21 1125 -- 98.6 F (37 C) -- -- -- --     Recent laboratory studies:  Recent Labs    09/04/21 0341  WBC 7.6  HGB 12.4*  HCT 36.9*  PLT 217  NA 134*  K 3.8  CL 101  CO2 27  BUN 8  CREATININE 0.85  GLUCOSE 118*  CALCIUM 8.5*     Discharge Medications:   Allergies as of 09/04/2021       Reactions   Ibuprofen Nausea And Vomiting   Stomach bleeding        Medication List     STOP taking these medications    acetaminophen 500 MG tablet Commonly known as: TYLENOL   predniSONE 20 MG tablet Commonly known as: DELTASONE   traMADol 50 MG tablet Commonly known as: ULTRAM       TAKE these medications  aspirin EC 81 MG tablet Take 1 tablet (81 mg total) by mouth 2 (two) times daily. To be taken after surgery   docusate sodium 100 MG capsule Commonly known as: Colace Take 1 capsule (100 mg total) by mouth daily as needed.   methocarbamol 500 MG tablet Commonly known as: Robaxin Take 1 tablet (500 mg total) by mouth 2 (two) times daily as needed. To be taken after surgery   ondansetron 4 MG tablet Commonly known as: Zofran Take 1 tablet (4 mg total) by mouth every 8 (eight) hours as needed for nausea or vomiting.   oxyCODONE-acetaminophen 5-325 MG tablet Commonly known as: Percocet Take 1-2 tablets by mouth every 6 (six) hours as needed. To be taken after surgery               Durable Medical Equipment  (From admission, onward)           Start     Ordered   09/03/21 1158  DME Walker rolling  Once       Question:  Patient needs a walker to  treat with the following condition  Answer:  History of hip replacement   09/03/21 1157   09/03/21 1158  DME 3 n 1  Once        09/03/21 1157   09/03/21 1158  DME Bedside commode  Once       Question:  Patient needs a bedside commode to treat with the following condition  Answer:  History of hip replacement   09/03/21 1157            Diagnostic Studies: DG Pelvis Portable  Result Date: 09/03/2021 CLINICAL DATA:  Total left hip arthroplasty EXAM: PORTABLE PELVIS 1-2 VIEWS COMPARISON:  09/03/2021 fluoroscopy FINDINGS: Total left hip arthroplasty which is located and well seated in the frontal projection. No evidence of fracture. IMPRESSION: Unremarkable left hip arthroplasty. Electronically Signed   By: Tiburcio Pea M.D.   On: 09/03/2021 11:51   DG C-Arm 1-60 Min-No Report  Result Date: 09/03/2021 Fluoroscopy was utilized by the requesting physician.  No radiographic interpretation.   DG C-Arm 1-60 Min-No Report  Result Date: 09/03/2021 Fluoroscopy was utilized by the requesting physician.  No radiographic interpretation.   DG HIP OPERATIVE UNILAT WITH PELVIS LEFT  Result Date: 09/03/2021 CLINICAL DATA:  LEFT total hip arthroplasty EXAM: OPERATIVE LEFT HIP (WITH PELVIS IF PERFORMED) 3 VIEWS TECHNIQUE: Fluoroscopic spot image(s) were submitted for interpretation post-operatively. COMPARISON:  05/22/2021 FLUOROSCOPY TIME:  0 minutes 27 seconds Dose: 2.2706 mGy FINDINGS: Initial image demonstrates osseous demineralization with avascular necrosis changes of the LEFT femoral head and degenerative changes of the LEFT hip joint. Subsequent images demonstrate placement of a LEFT hip prosthesis without fracture or dislocation. IMPRESSION: LEFT hip arthroplasty without acute complication. Electronically Signed   By: Ulyses Southward M.D.   On: 09/03/2021 11:29    Disposition: Discharge disposition: 01-Home or Self Care          Follow-up Information     Tarry Kos, MD. Schedule an  appointment as soon as possible for a visit in 2 week(s).   Specialty: Orthopedic Surgery Contact information: [address] [place] [address] [phone]                  Signed: Cristie Hem 09/04/2021, 8:13 AM

## 2021-09-04 NOTE — TOC Progression Note (Signed)
Transition of Care [facility]) - Progression Note    Patient Details  Name: [name] MRN: [id number] Date of Birth: [birth year]  Transition of [facility] CM/SW Contact  Beckie Busing, RN Phone [phone]  09/04/2021, 11:07 AM  Clinical Narrative:    Message received from nurse requesting rolling walker for patient. Rolling walker has been ordered through charity with [employer]. Order given to [facility] who will have RW delivered to the room. No other needs noted at this time.         Expected Discharge Plan and Services           Expected Discharge Date: 09/04/21                                     Social Determinants of Health (SDOH) Interventions    Readmission Risk Interventions No flowsheet data found.

## 2021-09-04 NOTE — Telephone Encounter (Signed)
Pt called back and has some personal questions that need to be answered. He had missed the call from trying to pick up his script.   CB [phone]

## 2021-09-04 NOTE — Telephone Encounter (Signed)
ORDER MADE 

## 2021-09-04 NOTE — Telephone Encounter (Signed)
Patient called. Would like a call back from Dr. Roda Shutters. Says he has some questions. [phone]

## 2021-09-04 NOTE — Telephone Encounter (Signed)
Called patient no answer.   Referral made for outpatient PT since he is self pay & home health cannot come out.

## 2021-09-04 NOTE — Telephone Encounter (Signed)
Yes thanks 

## 2021-09-04 NOTE — Progress Notes (Signed)
Patient awaiting transport via wheelchair by volunteer for discharge home; in no acute distress nor complaints of pain nor discomfort; incision on his left hip with hydrocolloid dressing and is clean, dry and intact; room was checked and accounted for all his belongings; discharge instructions concerning his medications, incision care, follow up appointment and when to call the doctor as needed were all discussed with patient by RN and he expressed understanding on the instructions given.

## 2021-09-04 NOTE — Telephone Encounter (Signed)
Got an email from [facility].   "Dr Roda Shutters has a patient having surgery today who has no insurance . I wanted to let you know about this."  Paige Vanderwoude DOB Jun 22, 1991   Did you want him to go to Outpatient PT instead?

## 2021-09-04 NOTE — Plan of Care (Signed)
  Problem: Safety: Goal: Ability to remain free from injury will improve Outcome: Completed/Met   Problem: Education: Goal: Knowledge of the prescribed therapeutic regimen will improve Outcome: Completed/Met Goal: Understanding of discharge needs will improve Outcome: Completed/Met Goal: Individualized Educational Video(s) Outcome: Completed/Met   Problem: Activity: Goal: Ability to avoid complications of mobility impairment will improve Outcome: Completed/Met Goal: Ability to tolerate increased activity will improve Outcome: Completed/Met   Problem: Clinical Measurements: Goal: Postoperative complications will be avoided or minimized Outcome: Completed/Met   Problem: Pain Management: Goal: Pain level will decrease with appropriate interventions Outcome: Completed/Met   Problem: Skin Integrity: Goal: Will show signs of wound healing Outcome: Completed/Met

## 2021-09-04 NOTE — Telephone Encounter (Signed)
Called back and he said he will wait for appt to discuss with Dr Roda Shutters.  I did advise him regarding PT.  He is aware and has appt scheduled already.

## 2021-09-04 NOTE — Progress Notes (Signed)
Physical Therapy Treatment Patient Details Name: [name] MRN: [id number] DOB: 01-Feb-1991 Today's Date: 09/04/2021   History of Present Illness Pt is a 30 y/o male s/p L THA, direct anterior. PMH includes smoker and chron's.    PT Comments    Session focused on stair negotiation in which pt with good return demonstration. Pt continues to require v/c's to look forward when walking and to increased step length and height for reciprocal gait pattern. Pt with improved pattern when PT pushing RW forward continuously. Acute PT to cont to follow.    Recommendations for follow up therapy are one component of a multi-disciplinary discharge planning process, led by the attending physician.  Recommendations may be updated based on patient status, additional functional criteria and insurance authorization.  Follow Up Recommendations  Follow physician's recommendations for discharge plan and follow up therapies     Assistance Recommended at Discharge Intermittent Supervision/Assistance  Equipment Recommendations  Rolling walker (2 wheels);BSC/3in1    Recommendations for Other Services       Precautions / Restrictions Precautions Precautions: Fall Restrictions Weight Bearing Restrictions: Yes LLE Weight Bearing: Weight bearing as tolerated     Mobility  Bed Mobility Overal bed mobility: Needs Assistance Bed Mobility: Supine to Sit     Supine to sit: Min assist;Supervision     General bed mobility comments: pt received sitting EOB    Transfers Overall transfer level: Needs assistance Equipment used: Rolling walker (2 wheels) Transfers: Sit to/from Stand Sit to Stand: Supervision           General transfer comment: supervision for safety    Ambulation/Gait Ambulation/Gait assistance: Min guard Gait Distance (Feet): 170 Feet Assistive device: Rolling walker (2 wheels) Gait Pattern/deviations: Decreased step length - left;Decreased weight shift to  left;Antalgic;Step-through pattern Gait velocity: Decreased Gait velocity interpretation: <1.31 ft/sec, indicative of household ambulator   General Gait Details: pt requiring verbal cues to look straight and increased step height and length to achieve fluid, reciprocal gait pattern   Stairs Stairs: Yes Stairs assistance: Min guard Stair Management: Two rails;Step to pattern;Sideways;Forwards;One rail Left Number of Stairs: 12 General stair comments: pt instructed on sequencing "up with the good (R), down witht he bad (L)" Pt also instructed on bilat HR forwards and using L hand rail sideways, pt with good return demo, pt's cousin also present to observe   Wheelchair Mobility    Modified Rankin (Stroke Patients Only)       Balance Overall balance assessment: Needs assistance Sitting-balance support: No upper extremity supported;Feet supported Sitting balance-Leahy Scale: Good     Standing balance support: Bilateral upper extremity supported;During functional activity Standing balance-Leahy Scale: Poor Standing balance comment: Reliant on BUE support                            Cognition Arousal/Alertness: Awake/alert Behavior During Therapy: WFL for tasks assessed/performed Overall Cognitive Status: Within Functional Limits for tasks assessed                                          Exercises Total Joint Exercises Ankle Circles/Pumps: AROM;Both;10 reps;Supine Quad Sets: AROM;Left;10 reps;Supine Gluteal Sets: AROM;Both;10 reps;Supine Long Arc Quad: AROM;Left;10 reps;Seated Marching in Standing: AROM;Left;10 reps;Standing    General Comments General comments (skin integrity, edema, etc.): vss      Pertinent Vitals/Pain Pain Assessment: 0-10 Faces Pain  Scale: Hurts little more Pain Location: L hip Pain Descriptors / Indicators: Grimacing;Guarding Pain Intervention(s): Monitored during session    Home Living Family/patient expects to  be discharged to:: Private residence Living Arrangements: Alone Available Help at Discharge: Family;Available 24 hours/day Type of Home: Apartment Home Access: Stairs to enter Entrance Stairs-Rails: Right Entrance Stairs-Number of Steps: flight   Home Layout: One level Home Equipment: None Additional Comments: Reports he may stay with his mom who has 1 porch step and 1 level house.    Prior Function            PT Goals (current goals can now be found in the care plan section) Acute Rehab PT Goals Patient Stated Goal: to go home PT Goal Formulation: With patient Time For Goal Achievement: 09/17/21 Potential to Achieve Goals: Good Progress towards PT goals: Progressing toward goals    Frequency    7X/week      PT Plan Current plan remains appropriate    Co-evaluation              AM-PAC PT "6 Clicks" Mobility   Outcome Measure  Help needed turning from your back to your side while in a flat bed without using bedrails?: A Little Help needed moving from lying on your back to sitting on the side of a flat bed without using bedrails?: A Little Help needed moving to and from a bed to a chair (including a wheelchair)?: A Little Help needed standing up from a chair using your arms (e.g., wheelchair or bedside chair)?: A Little Help needed to walk in hospital room?: A Little Help needed climbing 3-5 steps with a railing? : A Little 6 Click Score: 18    End of Session Equipment Utilized During Treatment: Gait belt Activity Tolerance: Patient tolerated treatment well Patient left: with call bell/phone within reach;in bed;with family/visitor present (sitting EOB) Nurse Communication: Mobility status PT Visit Diagnosis: Pain;Other abnormalities of gait and mobility (R26.89) Pain - Right/Left: Left Pain - part of body: Hip     Time: 1610-9604 PT Time Calculation (min) (ACUTE ONLY): 10 min  Charges:  $Gait Training: 8-22 mins $Therapeutic Exercise: 8-22 mins                      Lewis Shock, PT, DPT Acute Rehabilitation Services Pager #: [phone] Office #: [phone]    Iona Hansen 09/04/2021, 11:26 AM
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
## 2021-09-04 NOTE — Progress Notes (Signed)
Subjective: 1 Day Post-Op Procedure(s) (LRB): LEFT TOTAL HIP ARTHROPLASTY ANTERIOR APPROACH (Left) Patient reports pain as mild.    Objective: Vital signs in last 24 hours: Temp:  [98 F (36.7 C)-98.7 F (37.1 C)] 98.2 F (36.8 C) (11/29 0750) Pulse Rate:  [58-82] 75 (11/29 0750) Resp:  [14-18] 18 (11/29 0750) BP: (109-131)/(66-81) 131/75 (11/29 0750) SpO2:  [92 %-100 %] 100 % (11/29 0750)  Intake/Output from previous day: 11/28 0701 - 11/29 0700 In: 1780 [P.O.:480; I.V.:1000; IV Piggyback:300] Out: 200 [Blood:200] Intake/Output this shift: Total I/O In: 60 [P.O.:60] Out: -   Recent Labs    09/04/21 0341  HGB 12.4*   Recent Labs    09/04/21 0341  WBC 7.6  RBC 4.23  HCT 36.9*  PLT 217   Recent Labs    09/04/21 0341  NA 134*  K 3.8  CL 101  CO2 27  BUN 8  CREATININE 0.85  GLUCOSE 118*  CALCIUM 8.5*   No results for input(s): LABPT, INR in the last 72 hours.  Neurologically intact Neurovascular intact Sensation intact distally Intact pulses distally Dorsiflexion/Plantar flexion intact Incision: dressing C/D/I No cellulitis present Compartment soft   Assessment/Plan: 1 Day Post-Op Procedure(s) (LRB): LEFT TOTAL HIP ARTHROPLASTY ANTERIOR APPROACH (Left) Advance diet Up with therapy D/C IV fluids WBAT LLE D/C after second PT session        Cristie Hem 09/04/2021, 8:11 AM

## 2021-09-05 ENCOUNTER — Telehealth: Payer: Self-pay | Admitting: Orthopaedic Surgery

## 2021-09-05 ENCOUNTER — Other Ambulatory Visit: Payer: Self-pay | Admitting: Physician Assistant

## 2021-09-05 MED ORDER — KETOROLAC TROMETHAMINE 10 MG PO TABS: 10.0000 mg | ORAL_TABLET | Freq: Two times a day (BID) | ORAL | 0 refills | Status: DC | PRN

## 2021-09-05 NOTE — Telephone Encounter (Signed)
Patient was prescribed oxycodone following surgery. He is requesting a stronger medication for the pain.

## 2021-09-05 NOTE — Telephone Encounter (Signed)
Sent mychart msg.

## 2021-09-05 NOTE — Telephone Encounter (Signed)
I sent in toradol to help with pain

## 2021-09-06 ENCOUNTER — Other Ambulatory Visit: Payer: Self-pay

## 2021-09-06 ENCOUNTER — Ambulatory Visit (INDEPENDENT_AMBULATORY_CARE_PROVIDER_SITE_OTHER): Payer: Self-pay | Admitting: Rehabilitative and Restorative Service Providers"

## 2021-09-06 ENCOUNTER — Encounter: Payer: Self-pay | Admitting: Rehabilitative and Restorative Service Providers"

## 2021-09-06 DIAGNOSIS — M25552 Pain in left hip: Secondary | ICD-10-CM

## 2021-09-06 DIAGNOSIS — M25652 Stiffness of left hip, not elsewhere classified: Secondary | ICD-10-CM

## 2021-09-06 DIAGNOSIS — M6281 Muscle weakness (generalized): Secondary | ICD-10-CM

## 2021-09-06 DIAGNOSIS — R262 Difficulty in walking, not elsewhere classified: Secondary | ICD-10-CM

## 2021-09-06 NOTE — Patient Instructions (Signed)
Access Code: [email or web address] URL: [email or web address] Date: 09/06/2021 Prepared by: Pauletta Browns  Exercises Single Knee to Chest Stretch - 2-3 x daily - 7 x weekly - 1 sets - 5 reps - 20 seconds hold Bridge - 2-3 x daily - 7 x weekly - 2 sets - 10 reps - 5 seconds hold Clam - 2-3 x daily - 7 x weekly - 2 sets - 10 reps - 5 seconds hold
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
## 2021-09-13 ENCOUNTER — Encounter: Payer: Self-pay | Admitting: Physical Therapy

## 2021-09-14 ENCOUNTER — Other Ambulatory Visit: Payer: Self-pay

## 2021-09-14 ENCOUNTER — Ambulatory Visit (INDEPENDENT_AMBULATORY_CARE_PROVIDER_SITE_OTHER): Payer: Self-pay | Admitting: Physical Therapy

## 2021-09-14 ENCOUNTER — Encounter: Payer: Self-pay | Admitting: Physical Therapy

## 2021-09-14 DIAGNOSIS — M25652 Stiffness of left hip, not elsewhere classified: Secondary | ICD-10-CM

## 2021-09-14 DIAGNOSIS — R2689 Other abnormalities of gait and mobility: Secondary | ICD-10-CM

## 2021-09-14 DIAGNOSIS — M25552 Pain in left hip: Secondary | ICD-10-CM

## 2021-09-14 DIAGNOSIS — R262 Difficulty in walking, not elsewhere classified: Secondary | ICD-10-CM

## 2021-09-14 DIAGNOSIS — M6281 Muscle weakness (generalized): Secondary | ICD-10-CM

## 2021-09-14 NOTE — Therapy (Signed)
[facility] [address] [place], [place], [address] Phone: [phone]   Fax:  [phone]  Physical Therapy Treatment  Patient Details  Name: [name] MRN: [id number] Date of Birth: [birth year] Referring Provider (PT): Leandrew Koyanagi MD   Encounter Date: 09/14/2021   PT End of Session - 09/14/21 0910     Visit Number 2    Number of Visits 19    Date for PT Re-Evaluation 11/29/21    PT Start Time 0855   arrives 10  min late   PT Stop Time 0930    PT Time Calculation (min) 35 min    Activity Tolerance Patient tolerated treatment well    Behavior During Therapy [facility] for tasks assessed/performed             Past Medical History:  Diagnosis Date   Brief psychotic disorder ([place])    Cerebral concussion    Crohn's    Hard of hearing    Tics of organic origin    Weight loss     Past Surgical History:  Procedure Laterality Date   KNEE SURGERY     TOTAL HIP ARTHROPLASTY Left 09/03/2021   Procedure: LEFT TOTAL HIP ARTHROPLASTY ANTERIOR APPROACH;  Surgeon: Leandrew Koyanagi, MD;  Location: [place];  Service: Orthopedics;  Laterality: Left;  3-C    There were no vitals filed for this visit.   Subjective Assessment - 09/14/21 0904     Subjective relays the pain is not bad in his Lt hip, he relays some soreness around incision.    Pertinent History Smoker    How long can you sit comfortably? 30 minutes    How long can you stand comfortably? 5-10 minutes with walker    How long can you walk comfortably? Short distances with walker (to appointments and in the house)    Patient Stated Goals Get back to work and playing basketball    Pain Onset More than a month ago                               [facility] Adult PT Treatment/Exercise - 09/14/21 0001       Knee/Hip Exercises: Stretches   Hip Flexor Stretch Both;3 reps;20 seconds    Hip Flexor Stretch Limitations Single knee to chest other leg straight    Other Knee/Hip  Stretches figure 4 stretch for Lt hip rotators 3X20 sec on Lt      Knee/Hip Exercises: Aerobic   Nustep L5 X 6 min      Knee/Hip Exercises: Machines for Strengthening   Total Gym Leg Press DL 75# 3X10, then left leg only 25# 2X10      Knee/Hip Exercises: Standing   Other Standing Knee Exercises Lt hip marches and abductions 2#  2X 10 ea      Knee/Hip Exercises: Supine   Hip Adduction Isometric 15 reps    Hip Adduction Isometric Limitations 5 sec ball squeeze    Bridges Strengthening;15 reps    Bridges Limitations 5 seconds    Other Supine Knee/Hip Exercises clams red 2X10                       PT Short Term Goals - 09/06/21 1416       PT SHORT TERM GOAL #1   Title Jvion will be able to walk with a cane for all ADLs.    Baseline Gilford Rile  Time 3    Period Weeks    Status New    Target Date 09/27/21               PT Long Term Goals - 09/06/21 1417       PT LONG TERM GOAL #1   Title Improve FOTO to 54 in 19 visits.    Baseline 19    Time 12    Period Weeks    Status New    Target Date 11/29/21      PT LONG TERM GOAL #2   Title Atlee will report L hip/groin pain consistently 0-2/10 on the Numeric Pain Rating Scale.    Baseline 5/10 at evaluation    Time 12    Period Weeks    Status New    Target Date 11/29/21      PT LONG TERM GOAL #3   Title Improve hip flexion to 100 degrees and ER to 30 degrees.    Baseline 65-90 hip flexion and 8-22 ER.    Time 12    Period Weeks    Status New    Target Date 11/29/21      PT LONG TERM GOAL #4   Title Torben will have improved hip strength as assessed by FOTO, gait quality without an AD and MMT.    Baseline Deferred due to surgery 3 days ago but needs a walker and is WBAT.    Time 12    Period Weeks    Status New    Target Date 11/29/21      PT LONG TERM GOAL #5   Title Dez will be independent with his long-term HEP at [place].    Baseline Started today    Time 12    Period Weeks    Status New     Target Date 11/29/21                   Plan - 09/14/21 0919     Clinical Impression Statement he had good overall tolerance to exercises for Lt hip strength and ROM. He is able to ambulate safely for short distances now without SPC but still with trendelenburg and trunk lean due to weakness. We will work to progress this with PT and will monitor his soreness.    Personal Factors and Comorbidities Comorbidity 1    Comorbidities Smoker    Examination-Activity Limitations Stairs;Bed Mobility;Dressing;Stand;Sit;Bend;Lift;Transfers;Carry;Locomotion Level;Squat    Examination-Participation Restrictions Interpersonal Relationship;Occupation;Community Activity    Stability/Clinical Decision Making Stable/Uncomplicated    Rehab Potential Good    PT Frequency --   1-2X/week   PT Duration 12 weeks    PT Treatment/Interventions ADLs/Self Care Home Management;Electrical Stimulation;Cryotherapy;Gait training;Stair training;Functional mobility training;Therapeutic activities;Neuromuscular re-education;Balance training;Therapeutic exercise;Patient/family education;Manual techniques    PT Next Visit Plan how was soreness from last session, work on L hip AROM and strength (flexors and abductors emphasis), general L LE strength    PT Home Exercise Plan Access Code: [email or web address]    Consulted and Agree with Plan of Care Patient;Family member/caregiver    Family Member Consulted Brother             Patient will benefit from skilled therapeutic intervention in order to improve the following deficits and impairments:  Abnormal gait, Decreased activity tolerance, Decreased endurance, Decreased range of motion, Difficulty walking, Decreased strength, Increased edema, Increased muscle spasms, Impaired flexibility, Impaired perceived functional ability, Pain  Visit Diagnosis: Difficulty in walking, not elsewhere classified  Stiffness of left hip, not elsewhere  classified  Pain in left hip  Muscle  weakness (generalized)  Other abnormalities of gait and mobility     Problem List Patient Active Problem List   Diagnosis Date Noted   Status post total replacement of left hip 09/03/2021   Avascular necrosis of bone of left hip (HCC) 07/20/2021   OA (osteoarthritis) of hip 06/05/2020   Medical non-compliance 12/02/2011   Crohn disease (HCC) 07/01/2011    April Manson, PT,DPT 09/14/2021, 9:23 AM  [facility] Physical Therapy [address] [place], [place], [address] Phone: [phone]   Fax:  [phone]  Name: ABDEL EFFINGER MRN: [id number] Date of Birth: 02/08/1991

## 2021-09-17 ENCOUNTER — Other Ambulatory Visit: Payer: Self-pay

## 2021-09-17 ENCOUNTER — Ambulatory Visit (INDEPENDENT_AMBULATORY_CARE_PROVIDER_SITE_OTHER): Payer: Self-pay | Admitting: Physical Therapy

## 2021-09-17 ENCOUNTER — Encounter: Payer: Self-pay | Admitting: Physical Therapy

## 2021-09-17 DIAGNOSIS — M6281 Muscle weakness (generalized): Secondary | ICD-10-CM

## 2021-09-17 DIAGNOSIS — M25552 Pain in left hip: Secondary | ICD-10-CM

## 2021-09-17 DIAGNOSIS — M25652 Stiffness of left hip, not elsewhere classified: Secondary | ICD-10-CM

## 2021-09-17 DIAGNOSIS — R262 Difficulty in walking, not elsewhere classified: Secondary | ICD-10-CM

## 2021-09-17 DIAGNOSIS — R2689 Other abnormalities of gait and mobility: Secondary | ICD-10-CM

## 2021-09-17 NOTE — Therapy (Signed)
[facility] Physical Therapy [address] [place], [place], [address] Phone: [phone]   Fax:  [phone]  Physical Therapy Treatment  Patient Details  Name: [name] MRN: [id number] Date of Birth: 13-Apr-1991 Referring Provider (PT): Leandrew Koyanagi MD   Encounter Date: 09/17/2021   PT End of Session - 09/17/21 0928     Visit Number 3    Number of Visits 19    Date for PT Re-Evaluation 11/29/21    PT Start Time 0848    PT Stop Time 0923    PT Time Calculation (min) 35 min    Activity Tolerance Patient tolerated treatment well    Behavior During Therapy [facility] for tasks assessed/performed             Past Medical History:  Diagnosis Date   Brief psychotic disorder ([place])    Cerebral concussion    Crohn's    Hard of hearing    Tics of organic origin    Weight loss     Past Surgical History:  Procedure Laterality Date   KNEE SURGERY     TOTAL HIP ARTHROPLASTY Left 09/03/2021   Procedure: LEFT TOTAL HIP ARTHROPLASTY ANTERIOR APPROACH;  Surgeon: Leandrew Koyanagi, MD;  Location: [place];  Service: Orthopedics;  Laterality: Left;  3-C    There were no vitals filed for this visit.   Subjective Assessment - 09/17/21 0922     Subjective relays the pain is slowly getting better.    Pertinent History Smoker    How long can you sit comfortably? 30 minutes    How long can you stand comfortably? 5-10 minutes with walker    How long can you walk comfortably? Short distances with walker (to appointments and in the house)    Patient Stated Goals Get back to work and playing basketball    Pain Onset More than a month ago              [facility] Adult PT Treatment/Exercise - 09/17/21 0001       Knee/Hip Exercises: Stretches   Hip Flexor Stretch Left;3 reps;30 seconds    Hip Flexor Stretch Limitations standing lunge stretch with Rt foot on step    Other Knee/Hip Stretches figure 4 stretch for Lt hip rotators 3X20 sec on Lt      Knee/Hip Exercises:  Aerobic   Recumbent Bike seat 11 L2 X 6 min      Knee/Hip Exercises: Machines for Strengthening   Total Gym Leg Press DL 81# 3X10, then left leg only 31# 2X10      Knee/Hip Exercises: Standing   Lateral Step Up Left;10 reps;Hand Hold: 2;Step Height: 4"    Forward Step Up 10 reps;Hand Hold: 1;Step Height: 4"    Other Standing Knee Exercises Lt hip marches and abductions 2.5#  2X 10 ea    Other Standing Knee Exercises lateral walking at counter top 3 round trips      Knee/Hip Exercises: Supine   Hip Adduction Isometric 15 reps    Hip Adduction Isometric Limitations 5 sec ball squeeze    Bridges Strengthening;15 reps    Bridges Limitations 5 seconds    Other Supine Knee/Hip Exercises clams red 2X15                       PT Short Term Goals - 09/06/21 1416       PT SHORT TERM GOAL #1   Title [name] will be able to walk with  a cane for all ADLs.    Baseline Walker    Time 3    Period Weeks    Status New    Target Date 09/27/21               PT Long Term Goals - 09/06/21 1417       PT LONG TERM GOAL #1   Title Improve FOTO to 54 in 19 visits.    Baseline 19    Time 12    Period Weeks    Status New    Target Date 11/29/21      PT LONG TERM GOAL #2   Title [name] will report L hip/groin pain consistently 0-2/10 on the Numeric Pain Rating Scale.    Baseline 5/10 at evaluation    Time 12    Period Weeks    Status New    Target Date 11/29/21      PT LONG TERM GOAL #3   Title Improve hip flexion to 100 degrees and ER to 30 degrees.    Baseline 65-90 hip flexion and 8-22 ER.    Time 12    Period Weeks    Status New    Target Date 11/29/21      PT LONG TERM GOAL #4   Title [name] will have improved hip strength as assessed by FOTO, gait quality without an AD and MMT.    Baseline Deferred due to surgery 3 days ago but needs a walker and is WBAT.    Time 12    Period Weeks    Status New    Target Date 11/29/21      PT LONG TERM GOAL #5    Title [name] will be independent with his long-term HEP at [place].    Baseline Started today    Time 12    Period Weeks    Status New    Target Date 11/29/21                   Plan - 09/17/21 0930     Clinical Impression Statement He had good overall tolerance to exercise progression again today and is doing well post op. Continue POC.    Personal Factors and Comorbidities Comorbidity 1    Comorbidities Smoker    Examination-Activity Limitations Stairs;Bed Mobility;Dressing;Stand;Sit;Bend;Lift;Transfers;Carry;Locomotion Level;Squat    Examination-Participation Restrictions Interpersonal Relationship;Occupation;Community Activity    Stability/Clinical Decision Making Stable/Uncomplicated    Rehab Potential Good    PT Frequency --   1-2X/week   PT Duration 12 weeks    PT Treatment/Interventions ADLs/Self Care Home Management;Electrical Stimulation;Cryotherapy;Gait training;Stair training;Functional mobility training;Therapeutic activities;Neuromuscular re-education;Balance training;Therapeutic exercise;Patient/family education;Manual techniques    PT Next Visit Plan what did MD say/    PT Home Exercise Plan Access Code: [email or web address]    Consulted and Agree with Plan of Care Patient;Family member/caregiver    Family Member Consulted Brother             Patient will benefit from skilled therapeutic intervention in order to improve the following deficits and impairments:  Abnormal gait, Decreased activity tolerance, Decreased endurance, Decreased range of motion, Difficulty walking, Decreased strength, Increased edema, Increased muscle spasms, Impaired flexibility, Impaired perceived functional ability, Pain  Visit Diagnosis: Difficulty in walking, not elsewhere classified  Stiffness of left hip, not elsewhere classified  Pain in left hip  Muscle weakness (generalized)  Other abnormalities of gait and mobility     Problem List Patient Active Problem List   Diagnosis Date  Noted  Status post total replacement of left hip 09/03/2021   Avascular necrosis of bone of left hip (HCC) 07/20/2021   OA (osteoarthritis) of hip 06/05/2020   Medical non-compliance 12/02/2011   Crohn disease (HCC) 07/01/2011    April Manson, PT,DPT 09/17/2021, 9:38 AM  [facility] Therapy [address] [place], [place], [address] Phone: [phone]   Fax:  [phone]  Name: FINN AMOS MRN: [id number] Date of Birth: Aug 07, 1991

## 2021-09-18 ENCOUNTER — Ambulatory Visit (INDEPENDENT_AMBULATORY_CARE_PROVIDER_SITE_OTHER): Payer: Self-pay | Admitting: Physician Assistant

## 2021-09-18 ENCOUNTER — Encounter: Payer: Self-pay | Admitting: Orthopaedic Surgery

## 2021-09-18 DIAGNOSIS — Z96642 Presence of left artificial hip joint: Secondary | ICD-10-CM

## 2021-09-18 MED ORDER — OXYCODONE-ACETAMINOPHEN 5-325 MG PO TABS: 1.0000 | ORAL_TABLET | Freq: Three times a day (TID) | ORAL | 0 refills | Status: DC | PRN

## 2021-09-18 NOTE — Progress Notes (Signed)
° °  Post-Op Visit Note   Patient: [name]           Date of Birth: Mar 09, 1991           MRN: [id number] Visit Date: 09/18/2021 PCP: Patient, No Pcp Per (Inactive)   Assessment & Plan:  Chief Complaint:  Chief Complaint  Patient presents with   Left Hip - Routine Post Op   Visit Diagnoses:  Left total hip replacement  Plan: Patient is a pleasant 30 year old gentleman who comes in today 2 weeks status post left total hip replacement 09/03/2021.  He has been doing well.  He does note some soreness especially at night for which is relieved with Percocet.  Has been compliant taking his aspirin twice daily for DVT prophylaxis.  He has been in outpatient physical therapy making good progress.  Currently walking unassisted.  He denies any calf pain, chest pain or shortness of breath.  Examination of the left hip reveals well-healed surgical incision with nylon sutures in place.  No evidence of infection or cellulitis.  Calf soft nontender.  He is neurovascular intact distally.  Today, sutures removed and Steri-Strips applied.  He will continue with physical therapy as needed.  Continue with aspirin for another 4 weeks.  DVT prophylaxis reinforced.  He will follow-up with [place] in 4 weeks time for repeat evaluation and AP pelvis x-rays.  Call with concerns or questions in the meantime.  Follow-Up Instructions: Return in about 4 weeks (around 10/16/2021).   Orders:  No orders of the defined types were placed in this encounter.  No orders of the defined types were placed in this encounter.   Imaging: No new imaging  PMFS History: Patient Active Problem List   Diagnosis Date Noted   Status post total replacement of left hip 09/03/2021   Avascular necrosis of bone of left hip (HCC) 07/20/2021   OA (osteoarthritis) of hip 06/05/2020   Medical non-compliance 12/02/2011   Crohn disease (HCC) 07/01/2011   Past Medical History:  Diagnosis Date   Brief psychotic disorder (HCC)    Cerebral  concussion    Crohn's    Hard of hearing    Tics of organic origin    Weight loss     Family History  Problem Relation Age of Onset   Bipolar disorder Father    Alcohol abuse Father    Hypertension Mother    Prostate cancer Paternal Grandfather    Colon cancer Neg Hx     Past Surgical History:  Procedure Laterality Date   KNEE SURGERY     TOTAL HIP ARTHROPLASTY Left 09/03/2021   Procedure: LEFT TOTAL HIP ARTHROPLASTY ANTERIOR APPROACH;  Surgeon: Tarry Kos, MD;  Location[facility];  Service: Orthopedics;  Laterality: Left;  3-C   Social History   Occupational History   Occupation: Temp Work   Tobacco Use   Smoking status: Every Day    Types: Cigarettes, Cigars   Smokeless tobacco: Never  Vaping Use   Vaping Use: Never used  Substance and Sexual Activity   Alcohol use: Yes    Comment: occ   Drug use: Not Currently    Frequency: 5.0 times per week    Types: Marijuana   Sexual activity: Not on file

## 2021-09-19 ENCOUNTER — Encounter: Payer: Self-pay | Admitting: Physical Therapy

## 2021-09-20 ENCOUNTER — Encounter: Payer: Self-pay | Admitting: Physical Therapy

## 2021-09-20 ENCOUNTER — Ambulatory Visit (INDEPENDENT_AMBULATORY_CARE_PROVIDER_SITE_OTHER): Payer: Self-pay | Admitting: Physical Therapy

## 2021-09-20 ENCOUNTER — Other Ambulatory Visit: Payer: Self-pay

## 2021-09-20 DIAGNOSIS — M25552 Pain in left hip: Secondary | ICD-10-CM

## 2021-09-20 DIAGNOSIS — M6281 Muscle weakness (generalized): Secondary | ICD-10-CM

## 2021-09-20 DIAGNOSIS — M25652 Stiffness of left hip, not elsewhere classified: Secondary | ICD-10-CM

## 2021-09-20 DIAGNOSIS — R262 Difficulty in walking, not elsewhere classified: Secondary | ICD-10-CM

## 2021-09-20 DIAGNOSIS — R2689 Other abnormalities of gait and mobility: Secondary | ICD-10-CM

## 2021-09-20 NOTE — Therapy (Signed)
[facility] Physical Therapy [address] [place], [place], [address] Phone: [phone]   Fax:  [phone]  Physical Therapy Treatment  Patient Details  Name: [name] MRN: [id number] Date of Birth: [birth year] Referring Provider (PT): Tarry Kos MD   Encounter Date: 09/20/2021   PT End of Session - 09/20/21 1538     Visit Number 4    Number of Visits 19    Date for PT Re-Evaluation 11/29/21    PT Start Time 1522   pt arrived late   PT Stop Time 1555    PT Time Calculation (min) 33 min    Activity Tolerance Patient tolerated treatment well    Behavior During Therapy [facility] for tasks assessed/performed             Past Medical History:  Diagnosis Date   Brief psychotic disorder (HCC)    Cerebral concussion    Crohn's    Hard of hearing    Tics of organic origin    Weight loss     Past Surgical History:  Procedure Laterality Date   KNEE SURGERY     TOTAL HIP ARTHROPLASTY Left 09/03/2021   Procedure: LEFT TOTAL HIP ARTHROPLASTY ANTERIOR APPROACH;  Surgeon: Tarry Kos, MD;  Location: [facility];  Service: Orthopedics;  Laterality: Left;  3-C    There were no vitals filed for this visit.   Subjective Assessment - 09/20/21 1522     Subjective hip is just sore    Pertinent History Smoker    How long can you sit comfortably? 30 minutes    How long can you stand comfortably? 5-10 minutes with walker    How long can you walk comfortably? Short distances with walker (to appointments and in the house)    Patient Stated Goals Get back to work and playing basketball    Currently in Pain? Yes    Pain Score 3     Pain Location Hip    Pain Orientation Left    Pain Descriptors / Indicators Aching;Sore    Pain Type Surgical pain;Chronic pain    Pain Onset More than a month ago    Pain Frequency Constant    Aggravating Factors  walking, standing    Pain Relieving Factors meds                [facility] PT Assessment - 09/20/21 1539        Assessment   Medical Diagnosis s/p L THA anterior approach    Referring Provider (PT) Coralyn Mark Donnelly Stager MD    Onset Date/Surgical Date 09/03/21    Next MD Visit 10/16/21                           [facility] Adult PT Treatment/Exercise - 09/20/21 1523       Ambulation/Gait   Gait Comments use of miror for visual feedback of gait mechanics; improved Lt trunk lean in stance with repetition and cues.      Knee/Hip Exercises: Aerobic   Recumbent Bike seat 11 L3 X 8 min      Knee/Hip Exercises: Machines for Strengthening   Cybex Knee Extension LLE only 10# 3x10; visible quad shake due to fatigue and weakness    Total Gym Leg Press DL 40# 1U27, then left leg only 31# 2X10      Knee/Hip Exercises: Standing   Hip Flexion Both;2 sets;10 reps;Knee bent    Hip Flexion Limitations 2.5#  Side Lunges Both;1 set;10 reps    Side Lunges Limitations mod cues for technique    Hip Abduction Both;2 sets;10 reps;Knee straight    Abduction Limitations 2.5#    Other Standing Knee Exercises LLE deadlift with 1 UE support 2x10                       PT Short Term Goals - 09/20/21 1538       PT SHORT TERM GOAL #1   Title [place] will be able to walk with a cane for all ADLs.    Baseline Walker    Time 3    Period Weeks    Status Achieved    Target Date 09/27/21               PT Long Term Goals - 09/06/21 1417       PT LONG TERM GOAL #1   Title Improve FOTO to 54 in 19 visits.    Baseline 19    Time 12    Period Weeks    Status New    Target Date 11/29/21      PT LONG TERM GOAL #2   Title Amarri will report L hip/groin pain consistently 0-2/10 on the Numeric Pain Rating Scale.    Baseline 5/10 at evaluation    Time 12    Period Weeks    Status New    Target Date 11/29/21      PT LONG TERM GOAL #3   Title Improve hip flexion to 100 degrees and ER to 30 degrees.    Baseline 65-90 hip flexion and 8-22 ER.    Time 12    Period Weeks    Status New     Target Date 11/29/21      PT LONG TERM GOAL #4   Title Massie will have improved hip strength as assessed by FOTO, gait quality without an AD and MMT.    Baseline Deferred due to surgery 3 days ago but needs a walker and is WBAT.    Time 12    Period Weeks    Status New    Target Date 11/29/21      PT LONG TERM GOAL #5   Title Dez will be independent with his long-term HEP at [place].    Baseline Started today    Time 12    Period Weeks    Status New    Target Date 11/29/21                   Plan - 09/20/21 1539     Clinical Impression Statement Pt tolerated session well today with improved gait mechanics with visual and verbal cues.  Continues to need strengthening for LLE due to weakness.   Pain improving overall.    Personal Factors and Comorbidities Comorbidity 1    Comorbidities Smoker    Examination-Activity Limitations Stairs;Bed Mobility;Dressing;Stand;Sit;Bend;Lift;Transfers;Carry;Locomotion Level;Squat    Examination-Participation Restrictions Interpersonal Relationship;Occupation;Community Activity    Stability/Clinical Decision Making Stable/Uncomplicated    Rehab Potential Good    PT Frequency --   1-2X/week   PT Duration 12 weeks    PT Treatment/Interventions ADLs/Self Care Home Management;Electrical Stimulation;Cryotherapy;Gait training;Stair training;Functional mobility training;Therapeutic activities;Neuromuscular re-education;Balance training;Therapeutic exercise;Patient/family education;Manual techniques    PT Next Visit Plan continue working on strengthening, gait training, Lt hip ROM PRN    PT Home Exercise Plan Access Code: [email or web address]    Consulted and Agree with Plan of Care Patient  Family Member Consulted --             Patient will benefit from skilled therapeutic intervention in order to improve the following deficits and impairments:  Abnormal gait, Decreased activity tolerance, Decreased endurance, Decreased range of motion, Difficulty  walking, Decreased strength, Increased edema, Increased muscle spasms, Impaired flexibility, Impaired perceived functional ability, Pain  Visit Diagnosis: Difficulty in walking, not elsewhere classified  Stiffness of left hip, not elsewhere classified  Pain in left hip  Muscle weakness (generalized)  Other abnormalities of gait and mobility     Problem List Patient Active Problem List   Diagnosis Date Noted   Status post total replacement of left hip 09/03/2021   Avascular necrosis of bone of left hip ([place]) 07/20/2021   OA (osteoarthritis) of hip 06/05/2020   Medical non-compliance 12/02/2011   Crohn disease ([place]) 07/01/2011       Laureen Abrahams, PT, DPT 09/20/21 3:50 PM      [facility] [address] [place], [place], [address] Phone: [phone]   Fax:  [phone]  Name: OLIVIA BAGOT MRN: [id number] Date of Birth: 07-26-1991

## 2021-09-24 ENCOUNTER — Encounter: Payer: Self-pay | Admitting: Rehabilitative and Restorative Service Providers"

## 2021-09-26 ENCOUNTER — Ambulatory Visit (INDEPENDENT_AMBULATORY_CARE_PROVIDER_SITE_OTHER): Payer: Self-pay | Admitting: Physical Therapy

## 2021-09-26 ENCOUNTER — Other Ambulatory Visit: Payer: Self-pay

## 2021-09-26 DIAGNOSIS — R2689 Other abnormalities of gait and mobility: Secondary | ICD-10-CM

## 2021-09-26 DIAGNOSIS — M25552 Pain in left hip: Secondary | ICD-10-CM

## 2021-09-26 DIAGNOSIS — M6281 Muscle weakness (generalized): Secondary | ICD-10-CM

## 2021-09-26 DIAGNOSIS — M25652 Stiffness of left hip, not elsewhere classified: Secondary | ICD-10-CM

## 2021-09-26 DIAGNOSIS — R262 Difficulty in walking, not elsewhere classified: Secondary | ICD-10-CM

## 2021-09-26 NOTE — Therapy (Signed)
[facility] [address] [place], [place], [address] Phone: [phone]   Fax:  [phone]  Physical Therapy Treatment  Patient Details  Name: [name] MRN: [id number] Date of Birth: 10-Jan-1991 Referring Provider (PT): Leandrew Koyanagi MD   Encounter Date: 09/26/2021   PT End of Session - 09/26/21 0914     Visit Number 5    Number of Visits 19    Date for PT Re-Evaluation 11/29/21    PT Start Time 0856    PT Stop Time 0930    PT Time Calculation (min) 34 min    Activity Tolerance Patient tolerated treatment well    Behavior During Therapy [facility] for tasks assessed/performed             Past Medical History:  Diagnosis Date   Brief psychotic disorder ([place])    Cerebral concussion    Crohn's    Hard of hearing    Tics of organic origin    Weight loss     Past Surgical History:  Procedure Laterality Date   KNEE SURGERY     TOTAL HIP ARTHROPLASTY Left 09/03/2021   Procedure: LEFT TOTAL HIP ARTHROPLASTY ANTERIOR APPROACH;  Surgeon: Leandrew Koyanagi, MD;  Location: [place];  Service: Orthopedics;  Laterality: Left;  3-C    There were no vitals filed for this visit.   Subjective Assessment - 09/26/21 0913     Subjective denies signficant pain. He still feels some soreness and weakness in his Lt hip    Pertinent History Smoker    How long can you sit comfortably? 30 minutes    How long can you stand comfortably? 5-10 minutes with walker    How long can you walk comfortably? Short distances with walker (to appointments and in the house)    Patient Stated Goals Get back to work and playing basketball    Pain Onset More than a month ago              [facility] Adult PT Treatment/Exercise - 09/26/21 0001       Knee/Hip Exercises: Aerobic   Recumbent Bike L5 X 6 min      Knee/Hip Exercises: Machines for Strengthening   Cybex Knee Extension LLE only 10# 3x10; visible quad shake due to fatigue and weakness    Cybex Knee Flexion DL  35# 2X15    Total Gym Leg Press DL 87# 3X10, then left leg only 31# 3X10      Knee/Hip Exercises: Standing   Hip Flexion Both;2 sets;10 reps;Knee bent    Hip Flexion Limitations 2.5#    Hip Abduction Both;2 sets;10 reps;Knee straight    Abduction Limitations 2.5#    Functional Squat Limitations TRX squats X10    Other Standing Knee Exercises --    Other Standing Knee Exercises lateral walking at counter top 5 round trips with red band around his knees                       PT Short Term Goals - 09/20/21 1538       PT SHORT TERM GOAL #1   Title [name] will be able to walk with a cane for all ADLs.    Baseline Walker    Time 3    Period Weeks    Status Achieved    Target Date 09/27/21               PT Long Term Goals - 09/06/21 1417  PT LONG TERM GOAL #1   Title Improve FOTO to 54 in 19 visits.    Baseline 19    Time 12    Period Weeks    Status New    Target Date 11/29/21      PT LONG TERM GOAL #2   Title [name] will report L hip/groin pain consistently 0-2/10 on the Numeric Pain Rating Scale.    Baseline 5/10 at evaluation    Time 12    Period Weeks    Status New    Target Date 11/29/21      PT LONG TERM GOAL #3   Title Improve hip flexion to 100 degrees and ER to 30 degrees.    Baseline 65-90 hip flexion and 8-22 ER.    Time 12    Period Weeks    Status New    Target Date 11/29/21      PT LONG TERM GOAL #4   Title [name] will have improved hip strength as assessed by FOTO, gait quality without an AD and MMT.    Baseline Deferred due to surgery 3 days ago but needs a walker and is WBAT.    Time 12    Period Weeks    Status New    Target Date 11/29/21      PT LONG TERM GOAL #5   Title [name] will be independent with his long-term HEP at DC.    Baseline Started today    Time 12    Period Weeks    Status New    Target Date 11/29/21                   Plan - 09/26/21 0917     Clinical Impression Statement He had good  overall tolerance to strength program progression but does still have significant hip weakness affecting gait and overall functional abilities. PT recommending to continue POC.    Personal Factors and Comorbidities Comorbidity 1    Comorbidities Smoker    Examination-Activity Limitations Stairs;Bed Mobility;Dressing;Stand;Sit;Bend;Lift;Transfers;Carry;Locomotion Level;Squat    Examination-Participation Restrictions Interpersonal Relationship;Occupation;Community Activity    Stability/Clinical Decision Making Stable/Uncomplicated    Rehab Potential Good    PT Frequency --   1-2X/week   PT Duration 12 weeks    PT Treatment/Interventions ADLs/Self Care Home Management;Electrical Stimulation;Cryotherapy;Gait training;Stair training;Functional mobility training;Therapeutic activities;Neuromuscular re-education;Balance training;Therapeutic exercise;Patient/family education;Manual techniques    PT Next Visit Plan add SLS, continue working on strengthening, gait training, Lt hip ROM PRN    PT Home Exercise Plan Access Code: [email or web address]    Consulted and Agree with Plan of Care Patient             Patient will benefit from skilled therapeutic intervention in order to improve the following deficits and impairments:  Abnormal gait, Decreased activity tolerance, Decreased endurance, Decreased range of motion, Difficulty walking, Decreased strength, Increased edema, Increased muscle spasms, Impaired flexibility, Impaired perceived functional ability, Pain  Visit Diagnosis: Difficulty in walking, not elsewhere classified  Stiffness of left hip, not elsewhere classified  Pain in left hip  Muscle weakness (generalized)  Other abnormalities of gait and mobility     Problem List Patient Active Problem List   Diagnosis Date Noted   Status post total replacement of left hip 09/03/2021   Avascular necrosis of bone of left hip (HCC) 07/20/2021   OA (osteoarthritis) of hip 06/05/2020   Medical  non-compliance 12/02/2011   Crohn disease (HCC) 07/01/2011    April Manson, PT,DPT 09/26/2021, 9:29 AM  [facility] [address] [place], [place], [address] Phone: [phone]   Fax:  [phone]  Name: [name] MRN: [id number] Date of Birth: [birth year]

## 2021-10-02 ENCOUNTER — Telehealth: Payer: Self-pay | Admitting: Physical Therapy

## 2021-10-02 ENCOUNTER — Encounter: Payer: Self-pay | Admitting: Physical Therapy

## 2021-10-02 NOTE — Telephone Encounter (Signed)
Pt no show for PT appointment today. They were contacted and informed of this via voicemail. They were provided the date and time of their next appointment on voicemail. They were instructed to call us to let us know if they cannot make their appointment.  Ivery Quale, PT, DPT 10/02/21 9:05 AM

## 2021-10-04 ENCOUNTER — Other Ambulatory Visit: Payer: Self-pay

## 2021-10-04 ENCOUNTER — Ambulatory Visit (INDEPENDENT_AMBULATORY_CARE_PROVIDER_SITE_OTHER): Payer: Self-pay | Admitting: Physical Therapy

## 2021-10-04 DIAGNOSIS — M6281 Muscle weakness (generalized): Secondary | ICD-10-CM

## 2021-10-04 DIAGNOSIS — M25652 Stiffness of left hip, not elsewhere classified: Secondary | ICD-10-CM

## 2021-10-04 DIAGNOSIS — M25552 Pain in left hip: Secondary | ICD-10-CM

## 2021-10-04 DIAGNOSIS — R262 Difficulty in walking, not elsewhere classified: Secondary | ICD-10-CM

## 2021-10-04 DIAGNOSIS — R2689 Other abnormalities of gait and mobility: Secondary | ICD-10-CM

## 2021-10-04 NOTE — Therapy (Addendum)
[facility] Therapy [address] [place], [place], [address] Phone: [phone]   Fax:  [phone]  Physical Therapy Treatment/Discharge Summary  Patient Details  Name: [name] MRN: [id number] Date of Birth: November 18, 1990 Referring Provider (PT): Leandrew Koyanagi MD   Encounter Date: 10/04/2021   PT End of Session - 10/04/21 0946     Visit Number 6    Number of Visits 19    Date for PT Re-Evaluation 11/29/21    PT Start Time 0930    PT Stop Time 1000   he needed to leave early   PT Time Calculation (min) 30 min    Activity Tolerance Patient tolerated treatment well    Behavior During Therapy [facility] for tasks assessed/performed             Past Medical History:  Diagnosis Date   Brief psychotic disorder ([place])    Cerebral concussion    Crohn's    Hard of hearing    Tics of organic origin    Weight loss     Past Surgical History:  Procedure Laterality Date   KNEE SURGERY     TOTAL HIP ARTHROPLASTY Left 09/03/2021   Procedure: LEFT TOTAL HIP ARTHROPLASTY ANTERIOR APPROACH;  Surgeon: Leandrew Koyanagi, MD;  Location: [place];  Service: Orthopedics;  Laterality: Left;  3-C    There were no vitals filed for this visit.       [facility] PT Assessment - 10/04/21 0001       Assessment   Medical Diagnosis s/p L THA anterior approach    Referring Provider (PT) Leandrew Koyanagi MD    Onset Date/Surgical Date 09/03/21      ROM / Strength   AROM / PROM / Strength Strength      AROM   Left Hip Flexion 90   standing   Left Hip External Rotation  25   seated   Left Hip Internal Rotation  30   seated   Left Hip ABduction 40   in standing     Strength   Strength Assessment Site Hip;Knee    Right/Left Hip Left    Left Hip Flexion 5/5    Left Hip Extension 4/5    Left Hip ABduction 4/5    Left Hip ADduction 4+/5    Right/Left Knee Left    Left Knee Flexion 5/5    Left Knee Extension 5/5                           [facility] Adult PT  Treatment/Exercise - 10/04/21 0001       Ambulation/Gait   Gait Comments mild trunk lean sitll noted      Knee/Hip Exercises: Aerobic   Recumbent Bike L5 X 6 min      Knee/Hip Exercises: Machines for Strengthening   Total Gym Leg Press DL 100# 3X15, then left leg only 43# 3X10      Knee/Hip Exercises: Standing   Hip Flexion Left;15 reps    Hip Flexion Limitations red    Hip Abduction Left;15 reps    Abduction Limitations red    Hip Extension Left;15 reps    Extension Limitations red    Other Standing Knee Exercises lateral walking and monster walking 40 feet X2 round trips ea with red band at ankles                       PT Short Term  Goals - 09/20/21 1538       PT SHORT TERM GOAL #1   Title [name] will be able to walk with a cane for all ADLs.    Baseline Walker    Time 3    Period Weeks    Status Achieved    Target Date 09/27/21               PT Long Term Goals - 10/04/21 1003       PT LONG TERM GOAL #1   Title Improve FOTO to 54 in 19 visits.    Baseline 19    Time 12    Period Weeks    Status On-going    Target Date 11/29/21      PT LONG TERM GOAL #2   Title [name] will report L hip/groin pain consistently 0-2/10 on the Numeric Pain Rating Scale.    Baseline 3/10 overall now    Time 12    Period Weeks    Status On-going    Target Date 11/29/21      PT LONG TERM GOAL #3   Title Improve hip flexion to 100 degrees and ER to 30 degrees.    Baseline ER to 25 deg now    Time 12    Period Weeks    Status On-going    Target Date 11/29/21      PT LONG TERM GOAL #4   Title [name] will have improved hip strength as assessed by FOTO, gait quality without an AD and MMT.    Baseline 4/5 sttill for abd and extension, would like to see him get to 5/5    Time 12    Period Weeks    Status On-going    Target Date 11/29/21      PT LONG TERM GOAL #5   Title [name] will be independent with his long-term HEP at DC.    Baseline progressed today     Time 12    Period Weeks    Status On-going    Target Date 11/29/21                   Plan - 10/04/21 1001     Clinical Impression Statement he has made good progress with PT thus far and showed improved ROM and strength measurments today. He does still lack hip abd and extension strength and lacks hip stability in stance phase of gait causing mild trunk lean. I progressed his HEP to focus on this and provided him with resistance band to take home today. I recommend 2-4 more weeks of PT depending on his progress.    Personal Factors and Comorbidities Comorbidity 1    Comorbidities Smoker    Examination-Activity Limitations Stairs;Bed Mobility;Dressing;Stand;Sit;Bend;Lift;Transfers;Carry;Locomotion Level;Squat    Examination-Participation Restrictions Interpersonal Relationship;Occupation;Community Activity    Stability/Clinical Decision Making Stable/Uncomplicated    Rehab Potential Good    PT Frequency --   1-2X/week   PT Duration 12 weeks    PT Treatment/Interventions ADLs/Self Care Home Management;Electrical Stimulation;Cryotherapy;Gait training;Stair training;Functional mobility training;Therapeutic activities;Neuromuscular re-education;Balance training;Therapeutic exercise;Patient/family education;Manual techniques    PT Next Visit Plan add SLS, continue working on strengthening, gait training, Lt hip ROM PRN    PT Home Exercise Plan Access Code: [email or web address]  URL: [email or web address]  Date: 10/04/2021  Prepared by: Elsie Ra    Exercises  Side Stepping with Resistance at Ankles - 1 x daily - 6 x weekly - 2-3 sets - 10 reps  Forward Monster Walks - 1 x  daily - 6 x weekly - 2-3 sets - 10 reps  Hip Extension with Resistance Loop - 1 x daily - 6 x weekly - 1-2 sets - 15-20 reps  Hip Abduction with Resistance Loop - 1 x daily - 6 x weekly - 1-2 sets - 15-20 reps  Hip and Knee Flexion with Anchored Resistance - 1 x daily - 6 x weekly - 1-2 sets - 15-20 reps    Consulted  and Agree with Plan of Care Patient             Patient will benefit from skilled therapeutic intervention in order to improve the following deficits and impairments:  Abnormal gait, Decreased activity tolerance, Decreased endurance, Decreased range of motion, Difficulty walking, Decreased strength, Increased edema, Increased muscle spasms, Impaired flexibility, Impaired perceived functional ability, Pain  Visit Diagnosis: Difficulty in walking, not elsewhere classified  Stiffness of left hip, not elsewhere classified  Pain in left hip  Muscle weakness (generalized)  Other abnormalities of gait and mobility     Problem List Patient Active Problem List   Diagnosis Date Noted   Status post total replacement of left hip 09/03/2021   Avascular necrosis of bone of left hip ([place]) 07/20/2021   OA (osteoarthritis) of hip 06/05/2020   Medical non-compliance 12/02/2011   Crohn disease ([place]) 07/01/2011    Debbe Odea, PT,DPT 10/04/2021, 10:07 AM  [facility] [address] [place], [place], [address] Phone: [phone]   Fax:  [phone]  Name: [name] MRN: [id number] Date of Birth: 1991-07-09  PHYSICAL THERAPY DISCHARGE SUMMARY  Visits from Start of Care: 6  Current functional level related to goals / functional outcomes: See above   Remaining deficits: unknown   Education / Equipment: HEP   Patient agrees to discharge. Patient goals were not met. Patient is being discharged due to not returning since the last visit.  Laureen Abrahams, PT, DPT 11/14/21 12:22 PM  [facility] [address] [place], [place], [address] Phone: [phone]   Fax:  [phone]

## 2021-10-16 ENCOUNTER — Ambulatory Visit (INDEPENDENT_AMBULATORY_CARE_PROVIDER_SITE_OTHER): Payer: Self-pay

## 2021-10-16 ENCOUNTER — Other Ambulatory Visit: Payer: Self-pay

## 2021-10-16 ENCOUNTER — Ambulatory Visit (INDEPENDENT_AMBULATORY_CARE_PROVIDER_SITE_OTHER): Payer: Self-pay | Admitting: Physician Assistant

## 2021-10-16 ENCOUNTER — Encounter: Payer: Self-pay | Admitting: Orthopaedic Surgery

## 2021-10-16 DIAGNOSIS — Z96642 Presence of left artificial hip joint: Secondary | ICD-10-CM

## 2021-10-16 NOTE — Progress Notes (Signed)
° °  Post-Op Visit Note   Patient: [name]           Date of Birth: 30-Dec-1990           MRN: [id number] Visit Date: 10/16/2021 PCP: Patient, No Pcp Per (Inactive)   Assessment & Plan:  Chief Complaint:  Chief Complaint  Patient presents with   Left Hip - Follow-up    Left total hip arthroplasty 09/03/2021   Visit Diagnoses:  1. History of total hip replacement, left     Plan: Patient is a pleasant 31 year old gentleman who comes in today 6 weeks status post left total hip replacement 09/03/2021.  He has been doing great.  He has minimal to no pain.  Has been working on his home exercise program.  Examination of his left hip reveals full range of motion without pain.  Painless logroll.  He is neurovascular intact distally.  At this point, he will continue to advance with activity as tolerated.  Continue to work on strengthening exercises.  We will allow him to return to work this coming Monday, 10/22/2021 no lifting greater than 15 pounds and no pushing or pulling as he does work for [employer] and has to push pull and lift heavy furniture.  He will follow-up with [place] in 6 weeks time for repeat evaluation.  Dental prophylaxis reinforced.  Call with concerns or questions.  Follow-Up Instructions: Return in about 1 week (around 10/23/2021).   Orders:  Orders Placed This Encounter  Procedures   XR Pelvis 1-2 Views   No orders of the defined types were placed in this encounter.   Imaging: XR Pelvis 1-2 Views  Result Date: 10/16/2021 X-rays demonstrate a well-seated prosthesis without complication   PMFS History: Patient Active Problem List   Diagnosis Date Noted   Status post total replacement of left hip 09/03/2021   Avascular necrosis of bone of left hip (HCC) 07/20/2021   OA (osteoarthritis) of hip 06/05/2020   Medical non-compliance 12/02/2011   Crohn disease ([place]) 07/01/2011   Past Medical History:  Diagnosis Date   Brief psychotic disorder ([place])    Cerebral concussion     Crohn's    Hard of hearing    Tics of organic origin    Weight loss     Family History  Problem Relation Age of Onset   Bipolar disorder Father    Alcohol abuse Father    Hypertension Mother    Prostate cancer Paternal Grandfather    Colon cancer Neg Hx     Past Surgical History:  Procedure Laterality Date   KNEE SURGERY     TOTAL HIP ARTHROPLASTY Left 09/03/2021   Procedure: LEFT TOTAL HIP ARTHROPLASTY ANTERIOR APPROACH;  Surgeon: Leandrew Koyanagi, MD;  Location: [place];  Service: Orthopedics;  Laterality: Left;  3-C   Social History   Occupational History   Occupation: Temp Work   Tobacco Use   Smoking status: Every Day    Types: Cigarettes, Cigars   Smokeless tobacco: Never  Vaping Use   Vaping Use: Never used  Substance and Sexual Activity   Alcohol use: Yes    Comment: occ   Drug use: Not Currently    Frequency: 5.0 times per week    Types: Marijuana   Sexual activity: Not on file

## 2022-01-08 ENCOUNTER — Telehealth: Payer: Self-pay

## 2022-01-08 NOTE — Telephone Encounter (Signed)
Patient stated that he as called up here a few times and a note was never put in.  ?Patient would like a note stating that he can return to work full time with no restrictions patient said he can pick up at anytime.  ?

## 2022-01-09 ENCOUNTER — Telehealth: Payer: Self-pay | Admitting: Radiology

## 2022-01-09 NOTE — Telephone Encounter (Signed)
Thanks

## 2022-01-09 NOTE — Telephone Encounter (Signed)
FYI ? ? ?Patient came in to the office stating that he received phone call that note to return to work was ready for him to pick up. No note at front. Patient was extremely upset, stating that he has 4 mouths to feed and he has to go back to work. Note entered that he was ok for work per [employer]. Note given to patient. ?

## 2023-01-20 ENCOUNTER — Encounter: Payer: Self-pay | Admitting: *Deleted

## 2023-03-18 ENCOUNTER — Telehealth: Payer: Self-pay | Admitting: Orthopaedic Surgery

## 2023-03-18 NOTE — Telephone Encounter (Signed)
Patient came in stating he needs a letter stating he got previous hip surgery with Dr. Roda Shutters 09/03/2021 and that he has metal in his body, he stated he would like a new card made like that one he had previously but a letter will do for now can we make sure it has his address and DOB on it. He has to take it to the [employer]

## 2023-03-18 NOTE — Telephone Encounter (Signed)
Letter and card given to patient.

## 2023-04-23 ENCOUNTER — Emergency Department (HOSPITAL_COMMUNITY): Admission: EM | Admit: 2023-04-23 | Discharge: 2023-04-23 | Discharge status: Against Medical Advice | Payer: Self-pay

## 2023-04-23 ENCOUNTER — Telehealth: Payer: Self-pay | Admitting: Orthopaedic Surgery

## 2023-04-23 NOTE — ED Notes (Signed)
Called for pt x3 times for triage with no response. Moved OTF.

## 2023-04-23 NOTE — Telephone Encounter (Signed)
Pt came in today requesting 2 notes one fore work stating he came in today to gather information for work and [employer] about Hip replacement surgery. It is already a note in the system stating he had surgery and when which can be printed but he needs another one stating he came in today to get this information for his job.

## 2023-05-07 ENCOUNTER — Ambulatory Visit: Payer: Self-pay | Admitting: Orthopaedic Surgery

## 2024-10-19 ENCOUNTER — Other Ambulatory Visit: Payer: Self-pay

## 2024-10-19 ENCOUNTER — Ambulatory Visit (HOSPITAL_COMMUNITY)
Admission: EM | Admit: 2024-10-19 | Discharge: 2024-10-20 | Disposition: A | Discharge status: Home / Self Care | Payer: MEDICAID | Attending: Psychiatry | Admitting: Psychiatry

## 2024-10-19 ENCOUNTER — Encounter (HOSPITAL_COMMUNITY): Payer: Self-pay | Admitting: Emergency Medicine

## 2024-10-19 ENCOUNTER — Emergency Department (HOSPITAL_COMMUNITY)
Admission: EM | Admit: 2024-10-19 | Discharge: 2024-10-19 | Disposition: A | Discharge status: Home / Self Care | Payer: MEDICAID

## 2024-10-19 DIAGNOSIS — F1010 Alcohol abuse, uncomplicated: Secondary | ICD-10-CM | POA: Insufficient documentation

## 2024-10-19 DIAGNOSIS — F1290 Cannabis use, unspecified, uncomplicated: Secondary | ICD-10-CM | POA: Insufficient documentation

## 2024-10-19 DIAGNOSIS — R9431 Abnormal electrocardiogram [ECG] [EKG]: Secondary | ICD-10-CM

## 2024-10-19 DIAGNOSIS — F1020 Alcohol dependence, uncomplicated: Secondary | ICD-10-CM | POA: Diagnosis not present

## 2024-10-19 DIAGNOSIS — Z79899 Other long term (current) drug therapy: Secondary | ICD-10-CM | POA: Insufficient documentation

## 2024-10-19 DIAGNOSIS — R000 Tachycardia, unspecified: Secondary | ICD-10-CM | POA: Diagnosis not present

## 2024-10-19 DIAGNOSIS — F1090 Alcohol use, unspecified, uncomplicated: Secondary | ICD-10-CM

## 2024-10-19 DIAGNOSIS — Z7982 Long term (current) use of aspirin: Secondary | ICD-10-CM | POA: Insufficient documentation

## 2024-10-19 LAB — CBC WITH DIFFERENTIAL/PLATELET
Abs Immature Granulocytes: 0.01 K/uL (ref 0.00–0.07)
Basophils Absolute: 0 K/uL (ref 0.0–0.1)
Basophils Relative: 1 %
Eosinophils Absolute: 0.1 K/uL (ref 0.0–0.5)
Eosinophils Relative: 4 %
HCT: 42.9 % (ref 39.0–52.0)
Hemoglobin: 14.5 g/dL (ref 13.0–17.0)
Immature Granulocytes: 0 %
Lymphocytes Relative: 35 %
Lymphs Abs: 0.9 K/uL (ref 0.7–4.0)
MCH: 30.4 pg (ref 26.0–34.0)
MCHC: 33.8 g/dL (ref 30.0–36.0)
MCV: 89.9 fL (ref 80.0–100.0)
Monocytes Absolute: 0.3 K/uL (ref 0.1–1.0)
Monocytes Relative: 10 %
Neutro Abs: 1.3 K/uL — ABNORMAL LOW (ref 1.7–7.7)
Neutrophils Relative %: 50 %
Platelets: 192 K/uL (ref 150–400)
RBC: 4.77 MIL/uL (ref 4.22–5.81)
RDW: 14.8 % (ref 11.5–15.5)
WBC: 2.6 K/uL — ABNORMAL LOW (ref 4.0–10.5)
nRBC: 0 % (ref 0.0–0.2)

## 2024-10-19 LAB — COMPREHENSIVE METABOLIC PANEL WITH GFR
ALT: 33 U/L (ref 0–44)
AST: 49 U/L — ABNORMAL HIGH (ref 15–41)
Albumin: 4.4 g/dL (ref 3.5–5.0)
Alkaline Phosphatase: 82 U/L (ref 38–126)
Anion gap: 17 — ABNORMAL HIGH (ref 5–15)
BUN: 17 mg/dL (ref 6–20)
CO2: 26 mmol/L (ref 22–32)
Calcium: 9.2 mg/dL (ref 8.9–10.3)
Chloride: 103 mmol/L (ref 98–111)
Creatinine, Ser: 0.95 mg/dL (ref 0.61–1.24)
GFR, Estimated: >60 mL/min
Glucose, Bld: 132 mg/dL — ABNORMAL HIGH (ref 70–99)
Potassium: 4.1 mmol/L (ref 3.5–5.1)
Sodium: 146 mmol/L — ABNORMAL HIGH (ref 135–145)
Total Bilirubin: 0.2 mg/dL (ref 0.0–1.2)
Total Protein: 7 g/dL (ref 6.5–8.1)

## 2024-10-19 LAB — LIPID PANEL
Cholesterol: 199 mg/dL (ref 0–200)
HDL: 115 mg/dL
LDL Cholesterol: 34 mg/dL (ref 0–99)
Total CHOL/HDL Ratio: 1.7 ratio
Triglycerides: 251 mg/dL — ABNORMAL HIGH
VLDL: 50 mg/dL — ABNORMAL HIGH (ref 0–40)

## 2024-10-19 LAB — ETHANOL: Alcohol, Ethyl (B): 375 mg/dL

## 2024-10-19 LAB — TSH: TSH: 0.494 u[IU]/mL (ref 0.350–4.500)

## 2024-10-19 MED ORDER — HALOPERIDOL 5 MG PO TABS: 5.0000 mg | ORAL_TABLET | Freq: Three times a day (TID) | ORAL | Status: DC | PRN

## 2024-10-19 MED ORDER — CHLORDIAZEPOXIDE HCL 25 MG PO CAPS: 25.0000 mg | ORAL_CAPSULE | Freq: Every day | ORAL | Status: DC

## 2024-10-19 MED ORDER — THIAMINE HCL 100 MG/ML IJ SOLN
100.0000 mg | Freq: Once | INTRAMUSCULAR | Status: AC
  Administered 2024-10-20: 100 mg via INTRAMUSCULAR
  Filled 2024-10-19: qty 2

## 2024-10-19 MED ORDER — DIPHENHYDRAMINE HCL 50 MG/ML IJ SOLN: 50.0000 mg | Freq: Three times a day (TID) | INTRAMUSCULAR | Status: DC | PRN

## 2024-10-19 MED ORDER — TRAZODONE HCL 50 MG PO TABS
50.0000 mg | ORAL_TABLET | Freq: Every evening | ORAL | Status: DC | PRN
  Administered 2024-10-19: 50 mg via ORAL
  Filled 2024-10-19: qty 1

## 2024-10-19 MED ORDER — DIPHENHYDRAMINE HCL 50 MG PO CAPS: 50.0000 mg | ORAL_CAPSULE | Freq: Three times a day (TID) | ORAL | Status: DC | PRN

## 2024-10-19 MED ORDER — CHLORDIAZEPOXIDE HCL 25 MG PO CAPS
50.0000 mg | ORAL_CAPSULE | Freq: Once | ORAL | Status: AC
  Administered 2024-10-20: 50 mg via ORAL
  Filled 2024-10-19: qty 2

## 2024-10-19 MED ORDER — ALUM & MAG HYDROXIDE-SIMETH 200-200-20 MG/5ML PO SUSP: 30.0000 mL | ORAL | Status: DC | PRN

## 2024-10-19 MED ORDER — HYDROXYZINE HCL 25 MG PO TABS: 25.0000 mg | ORAL_TABLET | Freq: Four times a day (QID) | ORAL | Status: DC | PRN

## 2024-10-19 MED ORDER — CHLORDIAZEPOXIDE HCL 25 MG PO CAPS: 25.0000 mg | ORAL_CAPSULE | ORAL | Status: DC

## 2024-10-19 MED ORDER — HYDROXYZINE HCL 25 MG PO TABS
25.0000 mg | ORAL_TABLET | Freq: Three times a day (TID) | ORAL | Status: DC | PRN
  Administered 2024-10-19: 25 mg via ORAL
  Filled 2024-10-19: qty 1

## 2024-10-19 MED ORDER — CHLORDIAZEPOXIDE HCL 25 MG PO CAPS: 25.0000 mg | ORAL_CAPSULE | Freq: Four times a day (QID) | ORAL | Status: DC | PRN

## 2024-10-19 MED ORDER — HALOPERIDOL LACTATE 5 MG/ML IJ SOLN: 10.0000 mg | Freq: Three times a day (TID) | INTRAMUSCULAR | Status: DC | PRN

## 2024-10-19 MED ORDER — LORAZEPAM 2 MG/ML IJ SOLN: 2.0000 mg | Freq: Three times a day (TID) | INTRAMUSCULAR | Status: DC | PRN

## 2024-10-19 MED ORDER — CHLORDIAZEPOXIDE HCL 25 MG PO CAPS
25.0000 mg | ORAL_CAPSULE | Freq: Four times a day (QID) | ORAL | Status: DC
  Administered 2024-10-20: 25 mg via ORAL
  Filled 2024-10-19: qty 1

## 2024-10-19 MED ORDER — RISPERIDONE 1 MG PO TABS
1.0000 mg | ORAL_TABLET | Freq: Every day | ORAL | Status: DC
  Administered 2024-10-19: 1 mg via ORAL
  Filled 2024-10-19: qty 1

## 2024-10-19 MED ORDER — LOPERAMIDE HCL 2 MG PO CAPS: 2.0000 mg | ORAL_CAPSULE | ORAL | Status: DC | PRN

## 2024-10-19 MED ORDER — ADULT MULTIVITAMIN W/MINERALS CH
1.0000 | ORAL_TABLET | Freq: Every day | ORAL | Status: DC
  Administered 2024-10-20: 1 via ORAL
  Filled 2024-10-19: qty 1

## 2024-10-19 MED ORDER — NICOTINE 14 MG/24HR TD PT24
14.0000 mg | MEDICATED_PATCH | Freq: Every day | TRANSDERMAL | Status: DC
  Administered 2024-10-20: 14 mg via TRANSDERMAL
  Filled 2024-10-19: qty 1

## 2024-10-19 MED ORDER — MAGNESIUM HYDROXIDE 400 MG/5ML PO SUSP: 30.0000 mL | Freq: Every day | ORAL | Status: DC | PRN

## 2024-10-19 MED ORDER — ONDANSETRON 4 MG PO TBDP: 4.0000 mg | ORAL_TABLET | Freq: Four times a day (QID) | ORAL | Status: DC | PRN

## 2024-10-19 MED ORDER — CHLORDIAZEPOXIDE HCL 25 MG PO CAPS: 25.0000 mg | ORAL_CAPSULE | Freq: Three times a day (TID) | ORAL | Status: DC

## 2024-10-19 MED ORDER — HALOPERIDOL LACTATE 5 MG/ML IJ SOLN: 5.0000 mg | Freq: Three times a day (TID) | INTRAMUSCULAR | Status: DC | PRN

## 2024-10-19 MED ORDER — ACETAMINOPHEN 325 MG PO TABS: 650.0000 mg | ORAL_TABLET | Freq: Four times a day (QID) | ORAL | Status: DC | PRN

## 2024-10-19 NOTE — ED Notes (Signed)
 NP Chinwendu Onuoha notified [facility] [address].
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
## 2024-10-19 NOTE — ED Notes (Signed)
 Patient called x3 for triage with no answer. LWBS.
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
## 2024-10-20 DIAGNOSIS — F1020 Alcohol dependence, uncomplicated: Secondary | ICD-10-CM

## 2024-10-20 LAB — ETHANOL: Alcohol, Ethyl (B): 25 mg/dL — ABNORMAL HIGH

## 2024-10-20 LAB — HEMOGLOBIN A1C
Hgb A1c MFr Bld: 5.2 % (ref 4.8–5.6)
Mean Plasma Glucose: 102.54 mg/dL

## 2024-10-20 NOTE — ED Notes (Signed)
 Pt sleeping at present, no distress noted.  Monitoring for safety.

## 2024-10-20 NOTE — ED Notes (Signed)
 Patient A&O x 4, ambulatory. Patient discharged in no acute distress. Patient denied SI/HI, A/VH upon discharge. Patient verbalized understanding of all discharge instructions reviewed on AVS via staff, to include follow up appointments, RX's and safety. Pt belongings returned to patient from locker intact. Patient escorted to lobby via staff for transport to destination. Safety maintained.

## 2024-10-20 NOTE — ED Notes (Signed)
 Pt A&O x 4, no distress noted, presents for alcohol detox.  Pt admits to hearing voices, takes Risperdal .  Alcohol level 375. Sleeping soundly at present, resp. Even I& unlabored.  Monitoring for safety.

## 2024-10-20 NOTE — Discharge Instructions (Addendum)
..  OBS Care Management   Based on the information that you have provided and the presenting issues outpatient services and resources for have been recommended.  It is imperative that you follow through with treatment recommendations within 5-7 days from the of discharge to mitigate further risk to your safety and mental well-being. A list of referrals has been provided below to get you started.  You are not limited to the list provided.  In case of an urgent crisis, you may contact the Mobile Crisis Unit with [facility] at [phone].   [facility] [address], SECOND FLOOR [place], [place] [address] [phone] OUTPATIENT Walk-in information: Please note, all walk-ins are first come & first serve, with limited number of availability.  Please note that to be eligible for services you must bring: ID or a piece of mail with your name [facility] address  Therapist for therapy:  Monday & Wednesdays: Please ARRIVE at 7:15 AM for registration Will START at 8:00 AM Every 1st & 2nd Friday of the month: Please ARRIVE at 10:15 AM for registration Will START at 1 PM - 5 PM  Psychiatrist for medication management: Monday - Friday:  Please ARRIVE at 7:15 AM for registration Will START at 8:00 AM  Regretfully, due to limited availability, please be aware that you may not been seen on the same day as walk-in. Please consider making an appoint or try again. Thank you for your patience and understanding.
